# Patient Record
Sex: Male | Born: 1940 | Race: Black or African American | Hispanic: No | Marital: Married | Smoking: Never smoker
Health system: Southern US, Community
[De-identification: ages and names within clinical notes are randomized; demographics above are authoritative.]

## PROBLEM LIST (undated history)

## (undated) DIAGNOSIS — I10 Essential (primary) hypertension: Secondary | ICD-10-CM

## (undated) DIAGNOSIS — J302 Other seasonal allergic rhinitis: Secondary | ICD-10-CM

## (undated) DIAGNOSIS — C801 Malignant (primary) neoplasm, unspecified: Secondary | ICD-10-CM

## (undated) DIAGNOSIS — R29898 Other symptoms and signs involving the musculoskeletal system: Secondary | ICD-10-CM

## (undated) HISTORY — PX: PROSTATE SURGERY: SHX751

## (undated) HISTORY — DX: Other symptoms and signs involving the musculoskeletal system: R29.898

## (undated) HISTORY — DX: Other seasonal allergic rhinitis: J30.2

## (undated) HISTORY — PX: KNEE SURGERY: SHX244

---

## 2009-08-30 DIAGNOSIS — M1990 Unspecified osteoarthritis, unspecified site: Secondary | ICD-10-CM | POA: Insufficient documentation

## 2012-03-13 ENCOUNTER — Emergency Department (HOSPITAL_BASED_OUTPATIENT_CLINIC_OR_DEPARTMENT_OTHER)
Admission: EM | Admit: 2012-03-13 | Discharge: 2012-03-13 | Disposition: A | Discharge status: Home / Self Care | Payer: Medicare Other | Attending: Emergency Medicine | Admitting: Emergency Medicine

## 2012-03-13 ENCOUNTER — Encounter (HOSPITAL_BASED_OUTPATIENT_CLINIC_OR_DEPARTMENT_OTHER): Payer: Self-pay | Admitting: *Deleted

## 2012-03-13 ENCOUNTER — Emergency Department (HOSPITAL_BASED_OUTPATIENT_CLINIC_OR_DEPARTMENT_OTHER): Payer: Medicare Other

## 2012-03-13 DIAGNOSIS — I10 Essential (primary) hypertension: Secondary | ICD-10-CM | POA: Insufficient documentation

## 2012-03-13 DIAGNOSIS — Z8679 Personal history of other diseases of the circulatory system: Secondary | ICD-10-CM | POA: Insufficient documentation

## 2012-03-13 DIAGNOSIS — Z859 Personal history of malignant neoplasm, unspecified: Secondary | ICD-10-CM | POA: Insufficient documentation

## 2012-03-13 DIAGNOSIS — R55 Syncope and collapse: Secondary | ICD-10-CM | POA: Insufficient documentation

## 2012-03-13 DIAGNOSIS — Z79899 Other long term (current) drug therapy: Secondary | ICD-10-CM | POA: Insufficient documentation

## 2012-03-13 HISTORY — DX: Essential (primary) hypertension: I10

## 2012-03-13 HISTORY — DX: Malignant (primary) neoplasm, unspecified: C80.1

## 2012-03-13 LAB — CBC WITH DIFFERENTIAL/PLATELET
Basophils Absolute: 0 10*3/uL (ref 0.0–0.1)
Basophils Relative: 0 % (ref 0–1)
Eosinophils Absolute: 0.3 10*3/uL (ref 0.0–0.7)
Eosinophils Relative: 4 % (ref 0–5)
HCT: 36.3 % — ABNORMAL LOW (ref 39.0–52.0)
MCH: 28.3 pg (ref 26.0–34.0)
MCHC: 33.3 g/dL (ref 30.0–36.0)
MCV: 85 fL (ref 78.0–100.0)
Monocytes Absolute: 0.9 10*3/uL (ref 0.1–1.0)
RDW: 13.8 % (ref 11.5–15.5)

## 2012-03-13 LAB — URINALYSIS, ROUTINE W REFLEX MICROSCOPIC
Glucose, UA: NEGATIVE mg/dL
Hgb urine dipstick: NEGATIVE
Leukocytes, UA: NEGATIVE
Specific Gravity, Urine: 1.022 (ref 1.005–1.030)
Urobilinogen, UA: 1 mg/dL (ref 0.0–1.0)

## 2012-03-13 LAB — TROPONIN I: Troponin I: <0.3 ng/mL (ref <0.30)

## 2012-03-13 LAB — RAPID URINE DRUG SCREEN, HOSP PERFORMED
Amphetamines: NOT DETECTED
Barbiturates: NOT DETECTED
Benzodiazepines: NOT DETECTED
Cocaine: NOT DETECTED
Opiates: NOT DETECTED
Tetrahydrocannabinol: NOT DETECTED

## 2012-03-13 LAB — COMPREHENSIVE METABOLIC PANEL
AST: 18 U/L (ref 0–37)
Albumin: 3.6 g/dL (ref 3.5–5.2)
BUN: 19 mg/dL (ref 6–23)
Calcium: 9.3 mg/dL (ref 8.4–10.5)
Creatinine, Ser: 1.4 mg/dL — ABNORMAL HIGH (ref 0.50–1.35)

## 2012-03-13 LAB — ETHANOL: Alcohol, Ethyl (B): 151 mg/dL — ABNORMAL HIGH (ref 0–11)

## 2012-03-13 NOTE — ED Notes (Addendum)
Passed out at a social gathering after having 2 alcoholic drinks. Incontinent of urine and had jerking of his left arm. Pt states he has had this same thing happen 3 other times without a reason being found. He is alert oriented and ambulatory on arrival. Wife is with him.

## 2012-03-13 NOTE — ED Provider Notes (Addendum)
History     CSN: [id number]  Arrival date & time 03/13/12  1754   First MD Initiated Contact with Patient 03/13/12 1806      Chief Complaint  Patient presents with  . Loss of Consciousness    (Consider location/radiation/quality/duration/timing/severity/associated sxs/prior treatment) Patient is a 72 y.o. male presenting with syncope. The history is provided by the patient.  Loss of Consciousness  This is a recurrent problem. The current episode started less than 1 hour ago. The problem occurs constantly. The problem has been resolved. He lost consciousness for a period of less than one minute. Associated with: Patient was sitting at the kitchen table drinking an alcoholic beverage when his wife looked over in his left hand was quivering and his head was slumped down. She asked him if he was okay and he answered quickly after and ask her what was going on. Pertinent negatives include chest pain, diaphoresis, dizziness, focal sensory loss, focal weakness, headaches, light-headedness, nausea, palpitations, seizures, visual change, vomiting and weakness. He has tried nothing for the symptoms. The treatment provided significant relief. His past medical history is significant for HTN. Past medical history comments: Prior history of A. fib with cardioversion into sinus rhythm. Also 3-4 similar episodes as today with negative workups in the past.    Past Medical History  Diagnosis Date  . Hypertension   . Cancer     Past Surgical History  Procedure Laterality Date  . Knee surgery    . Prostate surgery      No family history on file.  History  Substance Use Topics  . Smoking status: Never Smoker   . Smokeless tobacco: Not on file  . Alcohol Use: Yes      Review of Systems  Constitutional: Negative for diaphoresis.  Cardiovascular: Positive for syncope. Negative for chest pain and palpitations.  Gastrointestinal: Negative for nausea and vomiting.  Neurological: Negative for  dizziness, focal weakness, seizures, weakness, light-headedness and headaches.  All other systems reviewed and are negative.    Allergies  Iodine  Home Medications   Current Outpatient Rx  Name  Route  Sig  Dispense  Refill  . LOSARTAN POTASSIUM PO   Oral   Take by mouth.           BP 116/65  Pulse 79  Temp(Src) 98.2 F (36.8 C) (Oral)  Resp 18  Wt 235 lb (106.595 kg)  SpO2 100%  Physical Exam  Nursing note and vitals reviewed. Constitutional: He is oriented to person, place, and time. He appears well-developed and well-nourished. No distress.  HENT:  Head: Normocephalic and atraumatic.  Mouth/Throat: Oropharynx is clear and moist.  Eyes: Conjunctivae and EOM are normal. Pupils are equal, round, and reactive to light.  Neck: Normal range of motion. Neck supple.  Cardiovascular: Normal rate, regular rhythm and intact distal pulses.   No murmur heard. Pulmonary/Chest: Effort normal and breath sounds normal. No respiratory distress. He has no wheezes. He has no rales.  Abdominal: Soft. He exhibits no distension. There is no tenderness. There is no rebound and no guarding.  Musculoskeletal: Normal range of motion. He exhibits no edema and no tenderness.  Neurological: He is alert and oriented to person, place, and time.  Skin: Skin is warm and dry. No rash noted. No erythema.  Psychiatric: He has a normal mood and affect. His behavior is normal.    ED Course  Procedures (including critical care time)  Labs Reviewed  CBC WITH DIFFERENTIAL - Abnormal; Notable for  the following:    Hemoglobin 12.1 (*)    HCT 36.3 (*)    All other components within normal limits  COMPREHENSIVE METABOLIC PANEL - Abnormal; Notable for the following:    Glucose, Bld 110 (*)    Creatinine, Ser 1.40 (*)    Total Bilirubin 0.2 (*)    GFR calc non Af Amer 49 (*)    GFR calc Af Amer 57 (*)    All other components within normal limits  URINALYSIS, ROUTINE W REFLEX MICROSCOPIC - Abnormal;  Notable for the following:    Color, Urine AMBER (*)    Bilirubin Urine SMALL (*)    Ketones, ur 15 (*)    All other components within normal limits  ETHANOL - Abnormal; Notable for the following:    Alcohol, Ethyl (B) 151 (*)    All other components within normal limits  URINE RAPID DRUG SCREEN (HOSP PERFORMED)  TROPONIN I   Dg Chest 2 View  03/13/2012  *RADIOLOGY REPORT*  Clinical Data: Syncope.  Seizure.  CHEST - 2 VIEW  Comparison: None.  Findings: Lungs are clear.  Heart size is normal.  No pneumothorax or pleural effusion.  IMPRESSION: No acute disease.   Original Report Authenticated By: Holley Dexter, M.D.      Date: 03/13/2012  Rate: 71  Rhythm: normal sinus rhythm  QRS Axis: normal  Intervals: normal  ST/T Wave abnormalities: nonspecific T wave changes t wave inversion in inferior leads  Conduction Disutrbances:none  Narrative Interpretation:   Old EKG Reviewed: none available    1. Syncope       MDM   Patient presenting with a possible syncopal episode today. Family states that for less than 1 minute they noticed his head slumped and mild trembling of his left hand.  When wife went over to arouse him and ask him what was wrong he did answer her. He had no chest pain, vomiting, diaphoresis, palpitations or prodromal symptoms. He states that he been drinking alcohol today but otherwise feels fine. He was able to ambulate in without any difficulty. Patient does have a history of A. fib which was cardioverted 2 years ago and no further episodes.  He states he's had 3 episodes of syncope in the past where he became diaphoretic prior to the episode but that did not happen today. He's been checked out multiple times in all evaluations have been negative. Today patient's alcohol level is 151 but a negative drug screen and otherwise normal labs. EKG shows nonspecific T-wave inversion inferiorly but otherwise normal there is no old to compare. Low suspicion for this being a  cardiac event such as MI or ACS. Possibility of dysrhythmia especially given patient's history of A. fib. However today there is no evidence of dysrhythmias on EKG or monitor.  Feel this could be related to the alcohol most likely. Patient is from [place] and plans on traveling back to her tomorrow. He will then follow up with his regular doctor and they will discuss whether he needs to wear a heart monitor.        Gwyneth Sprout, MD 03/13/12 1931  Gwyneth Sprout, MD 03/13/12 Barry Brunner  Gwyneth Sprout, MD 03/13/12 [phone]

## 2014-03-08 IMAGING — CR DG CHEST 2V
2 series · 2 of 2 positions shown · non-contrast
Comparison: None.

CLINICAL DATA: Syncope.  Seizure.

CHEST - 2 VIEW

[w chest pa]
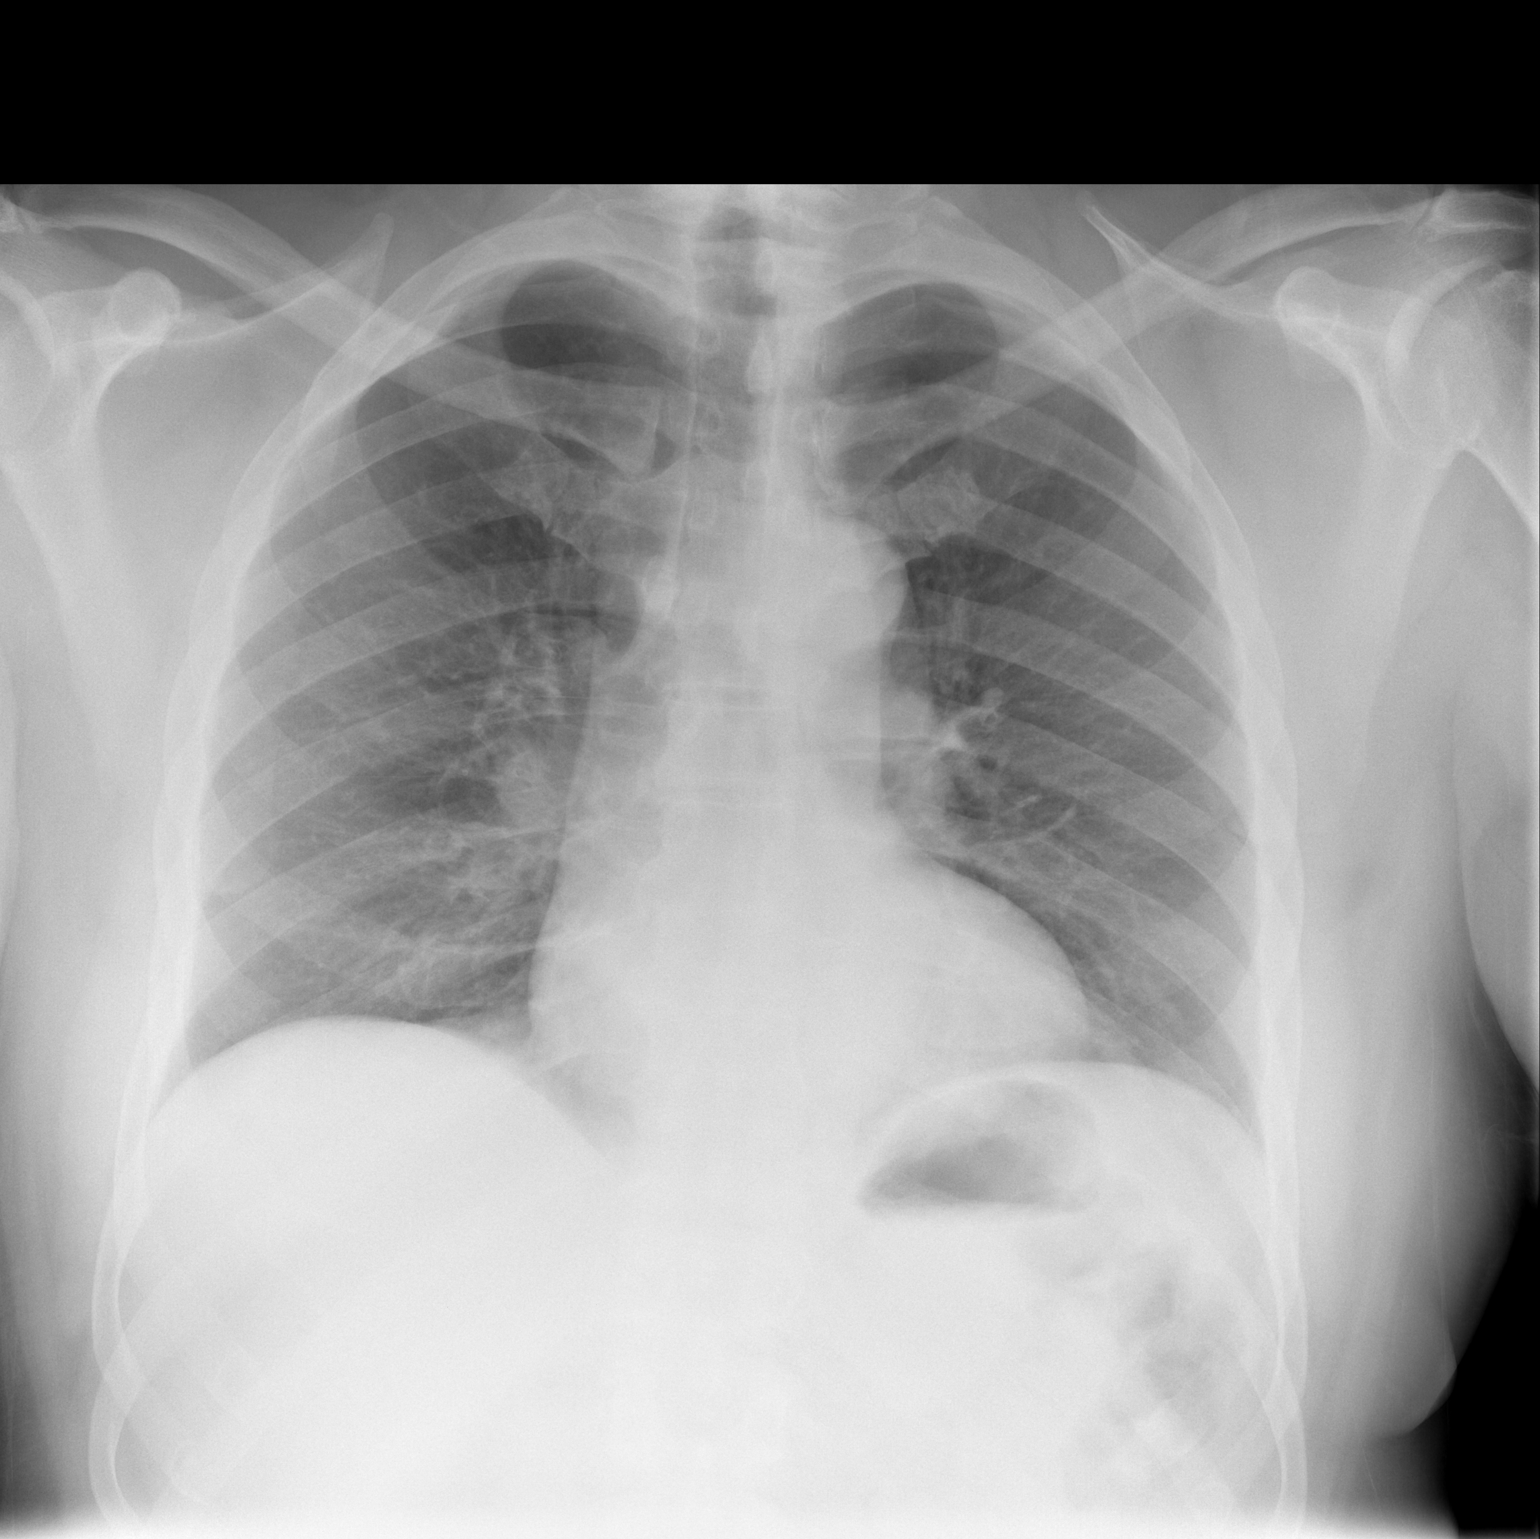

[w chest lat]
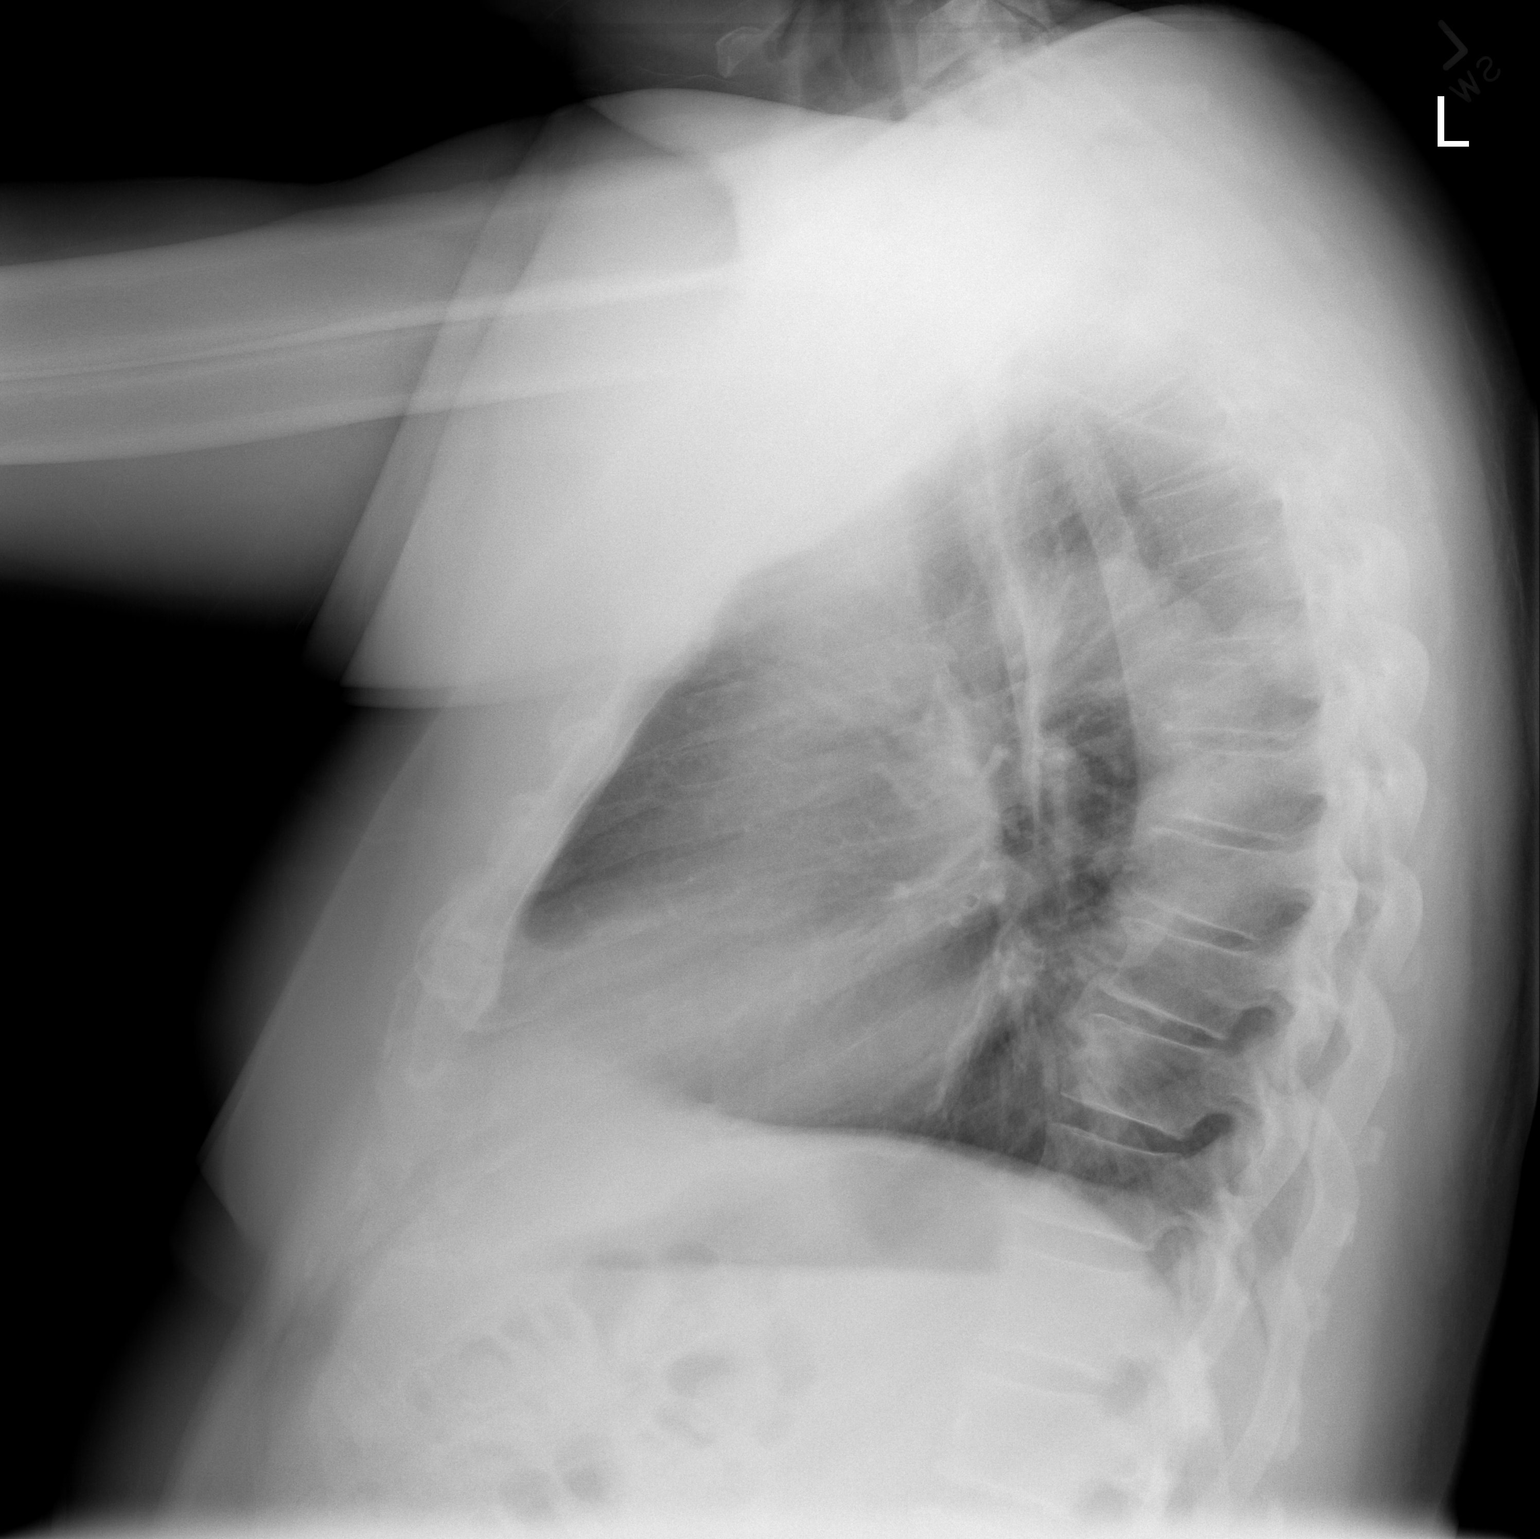

[2 of 2 positions shown; findings below may reference images not displayed]

FINDINGS: Lungs are clear.  Heart size is normal.  No pneumothorax
or pleural effusion.
IMPRESSION: No acute disease.

## 2014-07-28 DIAGNOSIS — R269 Unspecified abnormalities of gait and mobility: Secondary | ICD-10-CM | POA: Insufficient documentation

## 2014-08-14 DIAGNOSIS — E871 Hypo-osmolality and hyponatremia: Secondary | ICD-10-CM | POA: Insufficient documentation

## 2017-09-05 DIAGNOSIS — N1831 Chronic kidney disease, stage 3a: Secondary | ICD-10-CM | POA: Insufficient documentation

## 2020-01-21 DIAGNOSIS — Z8679 Personal history of other diseases of the circulatory system: Secondary | ICD-10-CM | POA: Insufficient documentation

## 2020-07-29 DIAGNOSIS — N189 Chronic kidney disease, unspecified: Secondary | ICD-10-CM | POA: Insufficient documentation

## 2020-07-29 DIAGNOSIS — I4891 Unspecified atrial fibrillation: Secondary | ICD-10-CM | POA: Insufficient documentation

## 2020-07-29 DIAGNOSIS — E669 Obesity, unspecified: Secondary | ICD-10-CM | POA: Insufficient documentation

## 2021-01-25 DIAGNOSIS — Z8546 Personal history of malignant neoplasm of prostate: Secondary | ICD-10-CM | POA: Insufficient documentation

## 2022-02-21 ENCOUNTER — Encounter: Payer: Self-pay | Admitting: Family Medicine

## 2022-02-21 ENCOUNTER — Ambulatory Visit (INDEPENDENT_AMBULATORY_CARE_PROVIDER_SITE_OTHER): Payer: Medicare Other | Admitting: Family Medicine

## 2022-02-21 VITALS — BP 122/70 | HR 64 | Temp 98.0°F | Ht 71.0 in | Wt 249.0 lb

## 2022-02-21 DIAGNOSIS — F411 Generalized anxiety disorder: Secondary | ICD-10-CM

## 2022-02-21 DIAGNOSIS — I10 Essential (primary) hypertension: Secondary | ICD-10-CM

## 2022-02-21 DIAGNOSIS — J4540 Moderate persistent asthma, uncomplicated: Secondary | ICD-10-CM

## 2022-02-21 DIAGNOSIS — R29898 Other symptoms and signs involving the musculoskeletal system: Secondary | ICD-10-CM

## 2022-02-21 DIAGNOSIS — J45909 Unspecified asthma, uncomplicated: Secondary | ICD-10-CM

## 2022-02-21 DIAGNOSIS — K5900 Constipation, unspecified: Secondary | ICD-10-CM | POA: Diagnosis not present

## 2022-02-21 DIAGNOSIS — N3281 Overactive bladder: Secondary | ICD-10-CM

## 2022-02-21 MED ORDER — TRELEGY ELLIPTA 200-62.5-25 MCG/ACT IN AEPB: INHALATION_SPRAY | RESPIRATORY_TRACT | 11 refills | Status: DC

## 2022-02-21 NOTE — Patient Instructions (Addendum)
Stay hydrated.   If you do not hear anything about your referral in the next 1-2 weeks, call our office and ask for an update.  Go back on Flonase.   Sleep Hygiene Tips: Do not watch TV or look at screens within 1 hour of going to bed. If you do, make sure there is a blue light filter (nighttime mode) involved. Try to go to bed around the same time every night. Wake up at the same time within 1 hour of regular time. Ex: If you wake up at 7 AM for work, do not sleep past 8 AM on days that you don't work. Do not drink alcohol before bedtime. Do not consume caffeine-containing beverages after noon or within 9 hours of intended bedtime. Get regular exercise/physical activity in your life, but not within 2 hours of planned bedtime. Do not take naps.  Do not eat within 2 hours of planned bedtime. Melatonin, 3 mg 30-60 minutes before planned bedtime may be helpful.  The bed should be for sleep or sex only. If after 20-30 minutes you are unable to fall asleep, get up and do something relaxing. Do this until you feel ready to go to sleep again.   Please consider counseling. Contact [phone] to schedule an appointment or inquire about cost/insurance coverage.  [facility] located at [place], [place], [place].  Phone number = [phone].  Dr. Lennice Sites - Adult Psychiatry.    [facility] located at [place], [place], [place]. Phone number = [phone].   [facility] located at [address], [place], [place].  Phone number = [phone].   [facility] located at [place], [place], [place].  Phone number = [phone].  Let us know if you need anything.

## 2022-02-21 NOTE — Progress Notes (Signed)
Chief Complaint  Patient presents with   New Patient (Initial Visit)    Constipation Insomnia        New Patient Visit SUBJECTIVE: HPI: Calvin Bautista is an 82 y.o.male who is being seen for establishing care.  He is here with his wife.  The patient was previously seen in [place].  Constipation Goes 3 times weekly on average. Has tried various OTC meds like milk of mag, Benefiber, MiraLAX, Senna. He tries to stay hydrated. Bitter melon helps.   Insomnia Around 1 yr has been having trouble staying asleep. Falling asleep is not an issue. Sometimes has racing thoughts. He tried melatonin with helped but he felt drowsy the next day.   Overactive bladder The patient has a history of overactive bladder currently taking Detrol 4 mg daily.  He reports compliance and no adverse effects that he is aware of.  It is helping with the symptoms.  He has no pain, bleeding, or discharge.  Patient has a history of moderate persistent asthma.  He is currently taking Trelegy 200-62.5-25 mcg, 1 puff daily.  He reports compliance and no adverse effects.  He has a history of allergies which he is compliant with Astelin, Allegra, and Singulair 10 mg daily.  His breathing is sound on this regimen.  He will sometimes have allergy symptoms.  He saw an allergist in [place] and was told he is allergic to pollen, dust, and grass.  Patient has a history of bilateral knee replacements.  After having his right knee replaced, he had a successful rehab.  After his left knee was replaced, he was less diligent with his home exercise program.  He still has residual weakness and balance issues because of it.  Sometimes his left lower extremity will drag.  He never saw physical therapy after the initial surgical rehab.  He likes to golf and this issue is preventing him from safely participating.  Past Medical History:  Diagnosis Date   Cancer ([place])    Hypertension    Leg weakness, bilateral    left is worse; started around 2014    Seasonal allergies    Past Surgical History:  Procedure Laterality Date   KNEE SURGERY     PROSTATE SURGERY     History reviewed. No pertinent family history. Allergies  Allergen Reactions   Iodine Anaphylaxis    Current Outpatient Medications:    albuterol (VENTOLIN HFA) 108 (90 Base) MCG/ACT inhaler, Inhale 2 puffs into the lungs every 6 (six) hours as needed., Disp: , Rfl:    azelastine (ASTELIN) 0.1 % nasal spray, Place 1 spray into both nostrils 2 (two) times daily., Disp: , Rfl:    diltiazem (TIAZAC) 240 MG 24 hr capsule, Take 240 mg by mouth daily., Disp: , Rfl:    fexofenadine (ALLEGRA) 180 MG tablet, Take 180 mg by mouth daily., Disp: , Rfl:    losartan (COZAAR) 50 MG tablet, Take 50 mg by mouth daily., Disp: , Rfl:    montelukast (SINGULAIR) 10 MG tablet, Take 10 mg by mouth at bedtime., Disp: , Rfl:    tolterodine (DETROL LA) 4 MG 24 hr capsule, Take 4 mg by mouth daily., Disp: , Rfl:    Fluticasone-Umeclidin-Vilant (TRELEGY ELLIPTA) 200-62.5-25 MCG/ACT AEPB, Inhale 1 puff daily., Disp: 28 each, Rfl: 11  OBJECTIVE: BP 122/70 (BP Location: Left Arm, Patient Position: Sitting, Cuff Size: Large)   Pulse 64   Temp 98 F (36.7 C) (Oral)   Ht 5' 11"$  (1.803 m)   Wt 249  lb (112.9 kg)   SpO2 98%   BMI 34.73 kg/m  General:  well developed, well nourished, in no apparent distress Skin:  no significant moles, warts, or growths Nose:  nares patent, septum midline, mucosa normal Throat/Pharynx:  lips and gingiva without lesion; tongue and uvula midline; non-inflamed pharynx; no exudates or postnasal drainage Lungs:  clear to auscultation, breath sounds equal bilaterally, no respiratory distress Cardio:  regular rate and rhythm, no LE edema or bruits Musculoskeletal:  symmetrical muscle groups noted without atrophy or deformity Neuro:  gait normal Psych: well oriented with normal range of affect and appropriate judgment/insight  ASSESSMENT/PLAN: Moderate persistent asthma  without complication - Plan: albuterol (VENTOLIN HFA) 108 (90 Base) MCG/ACT inhaler, Fluticasone-Umeclidin-Vilant (TRELEGY ELLIPTA) 200-62.5-25 MCG/ACT AEPB  GAD (generalized anxiety disorder)  Constipation, unspecified constipation type  Essential hypertension - Plan: diltiazem (TIAZAC) 240 MG 24 hr capsule, losartan (COZAAR) 50 MG tablet  OAB (overactive bladder) - Plan: tolterodine (DETROL LA) 4 MG 24 hr capsule  Weakness of left lower extremity - Plan: Ambulatory referral to Physical Therapy  Asthma due to environmental allergies - Plan: azelastine (ASTELIN) 0.1 % nasal spray, fexofenadine (ALLEGRA) 180 MG tablet, montelukast (SINGULAIR) 10 MG tablet  Chronic, stable.  Continue Trelegy 200-62.5-25 mcg daily.  If there are cost issues, would refer him to allergy team. He has sleep issues, and this is likely due to underlying anxiety.  Counseling information provided in paperwork.  He politely declined any medication at this time.  He will take 3 mg of melatonin to see if this can help with sleep without affecting him the next day. Continue bitter melon.  Stay hydrated. Chronic, stable.  Continue diltiazem 240 mg daily, losartan 50 mg daily.  Counseled on diet and exercise. Chronic, stable.  Continue Detrol 4 mg daily.  This medicine could be contributing to his constipation but is better now and is helpful, we will avoid making any changes. Refer to physical therapy. Chronic, stable.  Go back on Flonase, continue Astelin, Allegra, and Singulair 10 mg daily. Patient should return in 6 months or as needed. The patient and his spouse voiced understanding and agreement to the plan.   [place], DO 02/21/22  1:39 PM

## 2022-04-04 ENCOUNTER — Telehealth: Payer: Self-pay | Admitting: Family Medicine

## 2022-04-04 NOTE — Telephone Encounter (Signed)
Contacted Calvin Bautista to schedule their annual wellness visit. Appointment made for 04/13/2022.  Sherol Dade; Care Guide Ambulatory Clinical [facility] Direct Dial: [phone]

## 2022-04-13 ENCOUNTER — Telehealth: Payer: Self-pay | Admitting: Family Medicine

## 2022-04-13 ENCOUNTER — Ambulatory Visit: Payer: Medicare Other

## 2022-04-13 NOTE — Telephone Encounter (Signed)
Copied from [place] [id number]. Topic: Medicare AWV >> Apr 13, 2022  8:56 AM Devoria Glassing wrote: Reason for CRM: Called patient to reschedule Medicare Annual Wellness Visit (AWV). LVM 04/13/2022 to r/s awv scheduled AWV too soon due 11/2022   Last date of AWV: 11/29/21  Please schedule an appointment at any time with Beatris Ship, [place]  .  If any questions, please contact me.  Thank you ,  Sherol Dade; [facility] Direct Dial: [phone]

## 2022-05-09 ENCOUNTER — Telehealth: Payer: Self-pay | Admitting: Family Medicine

## 2022-05-09 DIAGNOSIS — R29898 Other symptoms and signs involving the musculoskeletal system: Secondary | ICD-10-CM

## 2022-05-09 NOTE — Telephone Encounter (Signed)
Pt stated he went over the the physical therapy office he was referred too, and they stated it needed to be renewed.

## 2022-05-09 NOTE — Telephone Encounter (Signed)
Referral done and patient aware

## 2022-06-01 NOTE — Therapy (Signed)
OUTPATIENT PHYSICAL THERAPY LOWER EXTREMITY EVALUATION   Patient Name: Calvin Bautista MRN: [id number] DOB:December 29, 1940, 82 y.o., male Today's Date: 06/02/2022   END OF SESSION:  PT End of Session - 06/02/22 0932     Visit Number 1    Date for PT Re-Evaluation 07/28/22    Authorization Type Medicare & [employer]    Progress Note Due on Visit 10    PT Start Time 0932    PT Stop Time 1023    PT Time Calculation (min) 51 min    Activity Tolerance Patient tolerated treatment well    Behavior During Therapy WFL for tasks assessed/performed             Past Medical History:  Diagnosis Date   Cancer (HCC)    Hypertension    Leg weakness, bilateral    left is worse; started around 2014   Seasonal allergies    Past Surgical History:  Procedure Laterality Date   KNEE SURGERY     PROSTATE SURGERY     Patient Active Problem List   Diagnosis Date Noted   Moderate persistent asthma without complication 02/21/2022   Essential hypertension 02/21/2022   GAD (generalized anxiety disorder) 02/21/2022   Weakness of left lower extremity 02/21/2022   OAB (overactive bladder) 02/21/2022    PCP: Sharlene Dory, DO   REFERRING PROVIDER: Sharlene Dory, DO  REFERRING DIAG: [phone] (ICD-10-CM) - Weakness of left lower extremity   THERAPY DIAG:  Unsteadiness on feet  Muscle weakness (generalized)  Repeated falls  RATIONALE FOR EVALUATION AND TREATMENT: Rehabilitation  ONSET DATE: Chronic x 10+ yrs, worsening since fall a few years ago  NEXT MD VISIT: 08/22/22   SUBJECTIVE:                                                                                                                                                                                                         SUBJECTIVE STATEMENT: Pt reports he has had both knees replaced over a decade ago. A few years ago he fell off a ladder in his home. Since then, he has noted more weakness in his legs as well as  decreased balance which affects his ability to play golf.  Issues occur with LOB during golf swing which has caused him to fall. Once he falls, he has difficulty getting up.  Otherwise when he falls it usually occurs while pivoting in standing.  Uses a walker in the shower to hold on and intermittently uses as cane in the house or when going out.  PAIN: Are you  having pain? 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 Yes. Number of falls 3  LIVING ENVIRONMENT: Lives with: lives with their spouse Lives in: House/apartment Stairs: No Has following equipment at home: Single point cane, Environmental consultant - 4 wheeled, and shower chair - walk-in shower  OCCUPATION: Retired  PLOF: Independent and Leisure: golf, gym up to 3x/wk  PATIENT GOALS: "To improve my balance so I can play golf again."   OBJECTIVE: (objective measures completed at initial evaluation unless otherwise dated)  DIAGNOSTIC FINDINGS:  11/06/20 - Lumbar MRI: CONCLUSION:  1. There is severe right L5-S1 neural foraminal stenosis, a potential etiology for right L5 nerve root impingement.  There is moderate to severe left L4-L5 neural foraminal stenosis.  There is moderate right L4-L5 neural foraminal stenosis.  There is moderate left L5-S1 neural foraminal stenosis.  2. There is moderate L4-L5 central canal stenosis.  There is mild L3-L4 central canal stenosis.  3. There is multilevel facet arthropathy, most severe at the L4-L5 level, where there is 4 mm anterolisthesis of L4 over L5   PATIENT SURVEYS:  ABC scale 690 / 1600 = 43.1 % LEFS 38 / 80 = 47.5 %  COGNITION: Overall cognitive status: Within functional limits for tasks assessed    SENSATION: WFL  EDEMA:  Mild intermittent edema  MUSCLE LENGTH: Hamstrings: mod  tight L, mild tight R ITB: mod tight B Piriformis: mod tight L>R Hip IR: mod tight B Hip flexors: mod/severe tight B Quads: mod tight B Heelcord: NT  POSTURE:  rounded shoulders, forward head, flexed trunk , and weight shift right  LOWER EXTREMITY ROM:  Active ROM Right eval Left eval  Knee flexion 6 2  Knee extension 115 104   Passive ROM Right eval Left eval  Knee flexion    Knee extension 0 0  (Blank rows = not tested)  LOWER EXTREMITY MMT:  MMT Right eval Left eval  Hip flexion 4+ 4  Hip extension 2+ 3-  Hip abduction 3- 3-  Hip adduction 3- 3-  Hip internal rotation 3+ 4-  Hip external rotation 4- 4-  Knee flexion 4+ 4  Knee extension 4 4  Ankle dorsiflexion 4+ 3+  Ankle plantarflexion 4 (16 SLS HR - limited lift) 4 (13 SLS HR - limited lift)  Ankle inversion    Ankle eversion     (Blank rows = not tested)  LOWER EXTREMITY SPECIAL TESTS:  Hip special tests: Ober's test: positive   FUNCTIONAL TESTS:  5 times sit to stand: TBA Timed up and go (TUG): TBA 10 meter walk test: TBA Berg Balance Scale: 28/56; < 36 high risk for falls (close to 100%) Dynamic Gait Index: TBA Functional gait assessment: TBA  TRANSFERS: Assistive device utilized: None  Sit to stand: Modified independence - increased weight shift to R LE w/o UE assist Stand to sit: Modified independence - increased weight shift to R LE  Chair to chair: Modified independence Floor:  NT  GAIT: Distance walked: 60 ft Assistive device utilized: None Level of assistance: SBA Gait pattern: step through pattern, decreased stride length, decreased hip/knee flexion- Right, decreased hip/knee flexion- Left, decreased trunk rotation, trunk flexed, wide BOS, poor foot clearance- Right, and poor foot clearance- Left Comments: Slow cadence with increased lateral sway   TODAY'S TREATMENT:  06/02/22 Eval only   PATIENT EDUCATION:  Education details: PT eval findings and anticipated POC  Person  educated: Patient Education method: Explanation Education comprehension: verbalized understanding  HOME EXERCISE PROGRAM: TBD   ASSESSMENT:  CLINICAL IMPRESSION: [name] is a 82 y.o. male who was seen today for physical therapy evaluation and treatment for L LE weakness.  He reports weakness originated following bilateral TKA 10+ years ago, exacerbated by a fall off of a ladder a few years ago.  Since this time he reports increased incidences of falls, most often associated with turning motions including attempting to swing a golf club.  He reports at least 3 falls in the past 6 months.  Current deficits include decreased LE flexibility, mildly limited knee ROM L>R, bilateral LE weakness, balance deficits and recurrent falls.  Initial balance testing with Sharlene Motts indicates a high risk for fall per score of 28/56.  Further balance testing indicated to identify risk for falls related to ambulation and transitional motions.  Khylen will benefit from skilled PT to address above deficits to improve mobility and activity tolerance with decreased risk for falls.   OBJECTIVE IMPAIRMENTS: Abnormal gait, decreased activity tolerance, decreased balance, decreased coordination, decreased knowledge of condition, decreased knowledge of use of DME, decreased mobility, difficulty walking, decreased ROM, decreased strength, decreased safety awareness, increased fascial restrictions, impaired perceived functional ability, increased muscle spasms, impaired flexibility, improper body mechanics, and postural dysfunction.   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   REHAB POTENTIAL: Good  CLINICAL DECISION MAKING: Evolving/moderate complexity  EVALUATION COMPLEXITY: Moderate   GOALS: Goals reviewed with patient? Yes  SHORT TERM GOALS: Target date: 06/30/2022  Patient will be independent with initial HEP. Baseline: TBD Goal status: INITIAL  2.  Complete balance assessment and update LTG's as indicated. Baseline: 5xSTS, , TUG, DGI vs FGA pending Goal status: INITIAL  3.  Patient will verbalize understanding of fall prevention measures in home to reduce risk for falls. Baseline: Patient experiencing recurrent falls, with at least 3 falls in the past 6 months. Goal status: INITIAL  LONG TERM GOALS: Target date: 07/28/2022  Patient will be independent with advanced/ongoing HEP to improve outcomes and carryover.  Baseline:  Goal status: INITIAL  2.  Patient will demonstrate improved L knee AROM to [facility] to allow for normal gait and stair mechanics. Baseline: L knee flexion limited to 104 Goal status: INITIAL  3.  Patient will demonstrate improved B LE strength to >/= 4 to 4+/5 for improved stability and ease of mobility. Baseline: Refer to above MMT table Goal status: INITIAL  4.  Patient will be able to ambulate 600' with or w/o LRAD and normal gait pattern without evidence of instability to safely access community.  Baseline:  Goal status: INITIAL  5. Patient will be able to ascend/descend stairs with 1 HR and reciprocal step pattern safely to access home and community.  Baseline: TBA Goal status: INITIAL  6.  Patient will report >/= 47/80 on LEFS to demonstrate improved functional ability. Baseline: 38 / 80 = 47.5 % Goal status: INITIAL  7.   Patient  will report >/= 62% on ABC scale to demonstrate improved balance confidence. Baseline:  690 / 1600 = 43.1 % Goal status: INITIAL   8.  Patient will demonstrate decreased TUG time to </= 13.5 sec to decrease risk for falls with transitional mobility. Baseline: TBA Goal status: INITIAL   9.  Patient will improve Berg score to >/= 36/56 to improve safety and stability with ADLs in standing and reduce risk for falls. Baseline: 28/56 Goal status: INITIAL   10.  Patient will demonstrate at least 19/24 on DGI to decrease risk of falls. Baseline: TBA Goal status: INITIAL   11.  Patient will be able to swing a golf club without loss of balance to allow him to resume playing golf. Baseline: Patient reports LOB leading to falls when swinging golf club. Goal status: INITIAL   PLAN:  PT FREQUENCY: 2x/week  PT DURATION: 8 weeks  PLANNED INTERVENTIONS: Therapeutic exercises, Therapeutic activity, Neuromuscular re-education, Balance training, Gait training, Patient/Family education, Self Care, Joint mobilization, Stair training, DME instructions, Dry Needling, Electrical stimulation, Spinal mobilization, Taping, Manual therapy, and Re-evaluation  PLAN FOR NEXT SESSION: Complete balance assessment - 5xSTS, , TUG, DGI vs FGA; create initial HEP   Marry Guan, PT 06/02/2022, 12:27 PM

## 2022-06-02 ENCOUNTER — Other Ambulatory Visit: Payer: Self-pay

## 2022-06-02 ENCOUNTER — Encounter: Payer: Self-pay | Admitting: Physical Therapy

## 2022-06-02 ENCOUNTER — Ambulatory Visit: Payer: Medicare HMO | Attending: Family Medicine | Admitting: Physical Therapy

## 2022-06-02 DIAGNOSIS — M6281 Muscle weakness (generalized): Secondary | ICD-10-CM | POA: Diagnosis present

## 2022-06-02 DIAGNOSIS — R29898 Other symptoms and signs involving the musculoskeletal system: Secondary | ICD-10-CM | POA: Insufficient documentation

## 2022-06-02 DIAGNOSIS — R296 Repeated falls: Secondary | ICD-10-CM | POA: Diagnosis present

## 2022-06-02 DIAGNOSIS — R2681 Unsteadiness on feet: Secondary | ICD-10-CM | POA: Insufficient documentation

## 2022-06-09 ENCOUNTER — Ambulatory Visit: Payer: Medicare HMO | Admitting: Physical Therapy

## 2022-06-09 ENCOUNTER — Encounter: Payer: Self-pay | Admitting: Physical Therapy

## 2022-06-09 DIAGNOSIS — R2681 Unsteadiness on feet: Secondary | ICD-10-CM

## 2022-06-09 DIAGNOSIS — M6281 Muscle weakness (generalized): Secondary | ICD-10-CM

## 2022-06-09 DIAGNOSIS — R296 Repeated falls: Secondary | ICD-10-CM

## 2022-06-09 NOTE — Therapy (Signed)
OUTPATIENT PHYSICAL THERAPY TREATMENT   Patient Name: Calvin Bautista MRN: [id number] DOB:07-Jan-1941, 82 y.o., male Today's Date: 06/09/2022   END OF SESSION:  PT End of Session - 06/09/22 0939     Visit Number 2    Date for PT Re-Evaluation 07/28/22    Authorization Type Medicare & [employer]    Progress Note Due on Visit 10    PT Start Time [phone]   Pt arrived late   PT Stop Time 1020    PT Time Calculation (min) 41 min    Activity Tolerance Patient tolerated treatment well    Behavior During Therapy WFL for tasks assessed/performed              Past Medical History:  Diagnosis Date   Cancer (HCC)    Hypertension    Leg weakness, bilateral    left is worse; started around 2014   Seasonal allergies    Past Surgical History:  Procedure Laterality Date   KNEE SURGERY     PROSTATE SURGERY     Patient Active Problem List   Diagnosis Date Noted   Moderate persistent asthma without complication 02/21/2022   Essential hypertension 02/21/2022   GAD (generalized anxiety disorder) 02/21/2022   Weakness of left lower extremity 02/21/2022   OAB (overactive bladder) 02/21/2022    PCP: Sharlene Dory, DO   REFERRING PROVIDER: Sharlene Dory, DO  REFERRING DIAG: [id number] (ICD-10-CM) - Weakness of left lower extremity   THERAPY DIAG:  Unsteadiness on feet  Muscle weakness (generalized)  Repeated falls  RATIONALE FOR EVALUATION AND TREATMENT: Rehabilitation  ONSET DATE: Chronic x 10+ yrs, worsening since fall a few years ago  NEXT MD VISIT: 08/22/22   SUBJECTIVE:                                                                                                                                                                                                         SUBJECTIVE STATEMENT: Pt reports he has had both knees replaced over a decade ago. A few years ago he fell off a ladder in his home. Since then, he has noted more weakness in his legs as well as  decreased balance which affects his ability to play golf.  Issues occur with LOB during golf swing which has caused him to fall. Once he falls, he has difficulty getting up.  Otherwise when he falls it usually occurs while pivoting in standing.  Uses a walker in the shower to hold on and intermittently uses as cane in the house or when going out.  PAIN: Are you having pain? 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 Yes. Number of falls 3  LIVING ENVIRONMENT: Lives with: lives with their spouse Lives in: House/apartment Stairs: No Has following equipment at home: Single point cane, Environmental consultant - 4 wheeled, and shower chair - walk-in shower  OCCUPATION: Retired  PLOF: Independent and Leisure: golf, gym up to 3x/wk  PATIENT GOALS: "To improve my balance so I can play golf again."   OBJECTIVE: (objective measures completed at initial evaluation unless otherwise dated)  DIAGNOSTIC FINDINGS:  11/06/20 - Lumbar MRI: CONCLUSION:  1. There is severe right L5-S1 neural foraminal stenosis, a potential etiology for right L5 nerve root impingement.  There is moderate to severe left L4-L5 neural foraminal stenosis.  There is moderate right L4-L5 neural foraminal stenosis.  There is moderate left L5-S1 neural foraminal stenosis.  2. There is moderate L4-L5 central canal stenosis.  There is mild L3-L4 central canal stenosis.  3. There is multilevel facet arthropathy, most severe at the L4-L5 level, where there is 4 mm anterolisthesis of L4 over L5   PATIENT SURVEYS:  ABC scale 690 / 1600 = 43.1 % LEFS 38 / 80 = 47.5 %  COGNITION: Overall cognitive status: Within functional limits for tasks assessed    SENSATION: WFL  EDEMA:  Mild intermittent edema  MUSCLE LENGTH: Hamstrings: mod  tight L, mild tight R ITB: mod tight B Piriformis: mod tight L>R Hip IR: mod tight B Hip flexors: mod/severe tight B Quads: mod tight B Heelcord: NT  POSTURE:  rounded shoulders, forward head, flexed trunk , and weight shift right  LOWER EXTREMITY ROM:  Active ROM Right eval Left eval  Knee flexion 6 2  Knee extension 115 104   Passive ROM Right eval Left eval  Knee flexion    Knee extension 0 0  (Blank rows = not tested)  LOWER EXTREMITY MMT:  MMT Right eval Left eval  Hip flexion 4+ 4  Hip extension 2+ 3-  Hip abduction 3- 3-  Hip adduction 3- 3-  Hip internal rotation 3+ 4-  Hip external rotation 4- 4-  Knee flexion 4+ 4  Knee extension 4 4  Ankle dorsiflexion 4+ 3+  Ankle plantarflexion 4 (16 SLS HR - limited lift) 4 (13 SLS HR - limited lift)  Ankle inversion    Ankle eversion     (Blank rows = not tested)  LOWER EXTREMITY SPECIAL TESTS:  Hip special tests: Ober's test: positive   FUNCTIONAL TESTS:  5 times sit to stand: 06/09/22: unable w/o UE assist (falling back into chair), 15.31 sec with single UE assist Timed up and go (TUG): 06/09/22: 14.96 sec, >13.5 sec indicates high fall risk 10 meter walk test: 06/09/22: 12.03 sec; Gait speed = 2.73 ft/sec Berg Balance Scale: 28/56; < 36 high risk for falls (close to 100%) Dynamic Gait Index: 06/09/22: 9/24, < 19 = high risk fall  Functional gait assessment: 06/09/22: 9/30, Scores of 19 or less are predicitve of falls in older community living adults.  TRANSFERS: Assistive device utilized: None  Sit to stand: Modified independence - increased weight shift to R LE w/o UE assist Stand to sit: Modified independence - increased weight shift to R LE  Chair to chair: Modified independence Floor:  NT  GAIT: Distance  walked: 60 ft Assistive device utilized: None Level of assistance: SBA Gait pattern: step through pattern, decreased arm swing- Left, decreased stride length, decreased hip/knee flexion- Right, decreased  hip/knee flexion- Left, decreased trunk rotation, trunk flexed, wide BOS, poor foot clearance- Right, and poor foot clearance- Left Comments: Slow cadence with increased lateral sway   TODAY'S TREATMENT:   06/09/22 THERAPEUTIC ACTIVITIES: 5xSTS: unable w/o UE assist (falling back into chair), 15.31 sec with single UE assist TUG: 14.96 sec, >13.5 sec indicates high fall risk  : 12.03 sec Gait speed = 2.73 ft/sec DGI: 9/24, < 19 = high risk fall  FGA: 9/30,  Scores of 19 or less are predicitve of falls in older community living adults. Stairs: Level of Assistance: SBA and CGA/min A Stair Negotiation Technique: Step to Pattern Alternating Pattern  with Single Rail on Left Number of Stairs: 14  Height of Stairs: 7"  Comments: Alternating pattern on a send however patient only placing ball of foot on stairs causing heel to drop over step edge of step and create posterior LOB, requiring CGA/min assist of PT to prevent loss of balance.  Step-to pattern on descent.  SELF CARE: Discussed results of balance testing and implication related to risk for falls, recommending consistent use of AD for ambulation both in and out of home.  Patient reports his wife frequently fusses at him to use his cane, but he admits to poor compliance with this.  He only uses RW for showering. Reviewed the "Check for Safety - Home Fall Prevention Checklist for Older Adults" to help identify fall risk hazards in the home along with strategies to reduce fall risk at home   06/02/22 Eval only   PATIENT EDUCATION:  Education details: PT eval findings, anticipated POC, and Check for Safety - Home Fall Prevention Checklist for Older Adults   Person educated: Patient Education method: Explanation, Verbal cues, and Handouts Education comprehension: verbalized understanding and needs further education  HOME EXERCISE PROGRAM: Access Code: [email or web address] URL: [email or web address] Date: 06/09/2022 Prepared  by: Glenetta Hew  Patient Education - Check for Safety   ASSESSMENT:  CLINICAL IMPRESSION: Balance testing completed with all standardized test indicating high risk for falls.  When attempting sit to stand without upper extremity assist patient experiences repeated posterior LOB causing him to fall back uncontrolled into chair.  Concern also noted with stair ascent as patient fails to place full foot on step causing him to have posterior weight shift off the edge of the step with associated posterior LOB.  Given identified high risk for fall discussed measures to reduce fall risk in home including consistent use of AD (patient arrived to therapy without any devices today but has SPC and RW at home) and review of "Check for Safety - Home Fall Prevention Checklist for Older Adults" to help identify fall risk hazards in the home along with strategies to reduce fall risk at home.   Calvin Bautista is a 82 y.o. male who was seen today for physical therapy evaluation and treatment for L LE weakness.  He reports weakness originated following bilateral TKA 10+ years ago, exacerbated by a fall off of a ladder a few years ago.  Since this time he reports increased incidences of falls, most often associated with turning motions including attempting to swing a golf club.  He reports at least 3 falls in the past 6 months.  Current deficits include decreased LE flexibility, mildly limited knee ROM L>R, bilateral LE weakness, balance deficits and recurrent falls.  Initial balance testing with Sharlene Motts indicates a high risk for fall per score of 28/56.  Further balance testing indicated to identify risk for falls related to ambulation and transitional motions.  Calvin Bautista will benefit from skilled PT to address above deficits to improve mobility and activity tolerance with decreased risk for falls.   OBJECTIVE IMPAIRMENTS: Abnormal gait, decreased activity tolerance, decreased balance, decreased coordination, decreased  knowledge of condition, decreased knowledge of use of DME, decreased mobility, difficulty walking, decreased ROM, decreased strength, decreased safety awareness, increased fascial restrictions, impaired perceived functional ability, increased muscle spasms, impaired flexibility, improper body mechanics, and postural dysfunction.   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   REHAB POTENTIAL: Good  CLINICAL DECISION MAKING: Evolving/moderate complexity  EVALUATION COMPLEXITY: Moderate   GOALS: Goals reviewed with patient? Yes  SHORT TERM GOALS: Target date: 06/30/2022  Patient will be independent with initial HEP. Baseline: TBD Goal status: IN PROGRESS  2.  Complete balance assessment and update LTG's as indicated. Baseline: 5xSTS, , TUG, DGI vs FGA pending Goal status: MET  3.  Patient will verbalize understanding of fall prevention measures in home to reduce risk for falls. Baseline: Patient experiencing recurrent falls, with at least 3 falls in the past 6 months. Goal status: IN PROGRESS  06/09/22 - discussed need for consistent use of AD for ambulation as well as reviewed the "Check for Safety - Home Fall Prevention Checklist for Older Adults" to help identify fall risk hazards in the home along with strategies to reduce fall risk at home   LONG TERM GOALS: Target date: 07/28/2022  Patient will be independent with advanced/ongoing HEP to improve outcomes  and carryover.  Baseline:  Goal status: IN PROGRESS  2.  Patient will demonstrate improved L knee AROM to [facility] to allow for normal gait and stair mechanics. Baseline: L knee flexion limited to 104 Goal status: IN PROGRESS  3.  Patient will demonstrate improved B LE strength to >/= 4 to 4+/5 for improved stability and ease of mobility. Baseline: Refer to above MMT table Goal status: IN PROGRESS  4.  Patient will be able to ambulate 600' with or w/o LRAD and normal gait pattern without evidence of instability to safely access community.  Baseline:  Goal status: IN PROGRESS  5. Patient will be able to ascend/descend stairs with 1 HR and reciprocal step pattern safely to access home and community.  Baseline: TBA Goal status: IN PROGRESS  6.  Patient will report >/= 47/80 on LEFS to demonstrate improved functional ability. Baseline: 38 / 80 = 47.5 % Goal status: IN PROGRESS  7.   Patient will report >/= 62% on ABC scale to demonstrate improved balance confidence. Baseline: 690 / 1600 = 43.1 % Goal status: IN PROGRESS   8.  Patient will demonstrate decreased TUG time to </= 13.5 sec to decrease risk for falls with transitional mobility. Baseline: 14.96 sec (06/09/22) Goal status: IN PROGRESS   9.  Patient will improve Berg score to >/= 36/56 to improve safety and stability with ADLs in standing and reduce risk for falls. Baseline: 28/56 Goal status:  IN PROGRESS   10.  Patient will demonstrate at least 19/24 on DGI to decrease risk of falls. Baseline: 9/24 (06/09/22) Goal status: IN PROGRESS   11.  Patient will be able to swing a golf club without loss of balance to allow him to resume playing golf. Baseline: Patient reports LOB leading to falls when swinging golf club. Goal status: IN PROGRESS   PLAN:  PT FREQUENCY: 2x/week  PT DURATION: 8 weeks  PLANNED INTERVENTIONS: Therapeutic exercises, Therapeutic activity, Neuromuscular re-education, Balance training, Gait training,  Patient/Family education, Self Care, Joint mobilization, Stair training, DME instructions, Dry Needling, Electrical stimulation, Spinal mobilization, Taping, Manual therapy, and Re-evaluation  PLAN FOR NEXT SESSION: create initial HEP - LE flexibility and strengthening incorporating functional movement patterns; gait training with SPC versus RW to determine safest level of assistive device   Marry Guan, PT 06/09/2022, 12:44 PM

## 2022-06-10 ENCOUNTER — Other Ambulatory Visit: Payer: Self-pay

## 2022-06-10 ENCOUNTER — Encounter (HOSPITAL_BASED_OUTPATIENT_CLINIC_OR_DEPARTMENT_OTHER): Payer: Self-pay

## 2022-06-10 ENCOUNTER — Emergency Department (HOSPITAL_BASED_OUTPATIENT_CLINIC_OR_DEPARTMENT_OTHER)
Admission: EM | Admit: 2022-06-10 | Discharge: 2022-06-10 | Disposition: A | Discharge status: Home / Self Care | Payer: Medicare HMO | Attending: Emergency Medicine | Admitting: Emergency Medicine

## 2022-06-10 DIAGNOSIS — R350 Frequency of micturition: Secondary | ICD-10-CM | POA: Insufficient documentation

## 2022-06-10 DIAGNOSIS — N183 Chronic kidney disease, stage 3 unspecified: Secondary | ICD-10-CM | POA: Insufficient documentation

## 2022-06-10 DIAGNOSIS — I129 Hypertensive chronic kidney disease with stage 1 through stage 4 chronic kidney disease, or unspecified chronic kidney disease: Secondary | ICD-10-CM | POA: Insufficient documentation

## 2022-06-10 DIAGNOSIS — Z7951 Long term (current) use of inhaled steroids: Secondary | ICD-10-CM | POA: Insufficient documentation

## 2022-06-10 DIAGNOSIS — J45909 Unspecified asthma, uncomplicated: Secondary | ICD-10-CM | POA: Insufficient documentation

## 2022-06-10 DIAGNOSIS — Z8546 Personal history of malignant neoplasm of prostate: Secondary | ICD-10-CM | POA: Diagnosis not present

## 2022-06-10 DIAGNOSIS — Z79899 Other long term (current) drug therapy: Secondary | ICD-10-CM | POA: Insufficient documentation

## 2022-06-10 DIAGNOSIS — R300 Dysuria: Secondary | ICD-10-CM | POA: Insufficient documentation

## 2022-06-10 LAB — URINALYSIS, W/ REFLEX TO CULTURE (INFECTION SUSPECTED)
Bilirubin Urine: NEGATIVE
Glucose, UA: NEGATIVE mg/dL
Hgb urine dipstick: NEGATIVE
Ketones, ur: NEGATIVE mg/dL
Leukocytes,Ua: NEGATIVE
Nitrite: NEGATIVE
Protein, ur: NEGATIVE mg/dL
RBC / HPF: NONE SEEN RBC/hpf (ref 0–5)
Specific Gravity, Urine: 1.02 (ref 1.005–1.030)
pH: 6.5 (ref 5.0–8.0)

## 2022-06-10 NOTE — ED Provider Notes (Addendum)
[facility] Provider Note   CSN: [id number] Arrival date & time: 06/10/22  0900     History  Chief Complaint  Patient presents with  . Dysuria    Calvin Bautista is a 82 y.o. male with asthma, HTN, GAD, OAB, h/o prostate cancer, CKD stage 3, atrial fibrillation, obesity who presents with increased frequency with urination and burning with urination on and off for a couple of months he has a history of overactive bladder for which he takes tolterodine, which she says helps the symptoms but he wonders if the dose is not high enough.  Denies fevers/chills, abdominal pain, penile discharge, hematuria, testicular or scrotal pain or swelling.  He does have a history of prostate cancer that was resected many years ago.  He has not been sexually active as a result and does not have any concern for STDs.  He does not have any blood thinners.  He has not seen a urologist since his prostate cancer many years ago.  HPI     Home Medications Prior to Admission medications   Medication Sig Start Date End Date Taking? Authorizing Provider  albuterol (VENTOLIN HFA) 108 (90 Base) MCG/ACT inhaler Inhale 2 puffs into the lungs every 6 (six) hours as needed. 12/20/19   [provider]  azelastine (ASTELIN) 0.1 % nasal spray Place 1 spray into both nostrils 2 (two) times daily. 04/27/20   [provider]  diltiazem (TIAZAC) 240 MG 24 hr capsule Take 240 mg by mouth daily. 02/08/21 02/21/23  [provider]  fexofenadine (ALLEGRA) 180 MG tablet Take 180 mg by mouth daily.    [provider]  Fluticasone-Umeclidin-Vilant (TRELEGY ELLIPTA) 200-62.5-25 MCG/ACT AEPB Inhale 1 puff daily. 02/21/22   Sharlene Dory, DO  losartan (COZAAR) 50 MG tablet Take 50 mg by mouth daily. 02/09/21   [provider]  montelukast (SINGULAIR) 10 MG tablet Take 10 mg by mouth at bedtime. 01/09/21   [provider]  tolterodine  (DETROL LA) 4 MG 24 hr capsule Take 4 mg by mouth daily. 12/24/21   [provider]      Allergies    Iodine    Review of Systems   Review of Systems Review of systems Negative for f/c.  A 10 point review of systems was performed and is negative unless otherwise reported in HPI.  Physical Exam Updated Vital Signs BP (!) 142/92   Pulse 88   Temp (!) 97.5 F (36.4 C) (Oral)   Resp 20   Ht 5\' 11"  (1.803 m)   Wt 108.9 kg   SpO2 99%   BMI 33.47 kg/m  Physical Exam General: Normal appearing male, lying in bed.  HEENT: Sclera anicteric, MMM, trachea midline.  Cardiology: RRR, no murmurs/rubs/gallops. BL radial and DP pulses equal bilaterally.  Resp: Normal respiratory rate and effort. CTAB, no wheezes, rhonchi, crackles.  Abd: Soft, non-tender, non-distended. No rebound tenderness or guarding.  Midline vertical surgical incision scar on abdomen and suprapubic region. GU: Normal-appearing genitalia. MSK: No peripheral edema or signs of trauma. Extremities without deformity or TTP. No cyanosis or clubbing. Skin: warm, dry. No rashes or lesions. Back: +R CVA tenderness Neuro: A&Ox4, CNs II-XII grossly intact. MAEs. Sensation grossly intact.  Psych: Normal mood and affect.   ED Results / Procedures / Treatments   Labs (all labs ordered are listed, but only abnormal results are displayed) Labs Reviewed  URINALYSIS, W/ REFLEX TO CULTURE (INFECTION SUSPECTED) - Abnormal; Notable for  the following components:      Result Value   Bacteria, UA RARE (*)    All other components within normal limits    EKG None  Radiology No results found.  Procedures Procedures    Medications Ordered in ED Medications - No data to display  ED Course/ Medical Decision Making/ A&P                          Medical Decision Making Amount and/or Complexity of Data Reviewed Labs:  Decision-making details documented in ED Course.    This patient presents to the ED for concern of  urinary frequency/burning, this involves an extensive number of treatment options, and is a complaint that carries with it a high risk of complications and morbidity.  Patient is overall very well-appearing and hemodynamically stable.  Afebrile.  MDM:    Patient with urinary frequency and burning with urination on and off for a couple of months with a history of overactive bladder.  Could have urinary retention and will obtain a PVR.  Patient relays that he thinks his symptoms are due to his overactive bladder.  Also consider UTI/cystitis.  Patient does have some associated right flank pain and positive right CVA tenderness, consider pyelonephritis.  He does not have very severe flank pain, overall lower concern for nephrolithiasis/ureterolithiasis, renal infarction, perinephric abscess.  If he is retaining urine could also have obstructive uropathy.  No abdominal tenderness palpation to indicate abdominal pathology such as biliary disease, appendicitis.  Clinical Course as of 06/10/22 1029  Fri Jun 10, 2022  0952 PVR 0 cc.  No urinary retention noted, unlikely obstructive uropathy. Patient is eating/drinking well, and emptying his bladder, do not believe labs are necessary at this time. [HN]  1022 Urinalysis, w/ Reflex to Culture (Infection Suspected) -Urine, Clean Catch(!) Neg for infection, hematuria, proteinuria.  [HN]    Clinical Course User Index [HN] Loetta Rough, MD    Labs: I Ordered, and personally interpreted labs.  The pertinent results include: Those listed above  Additional history obtained from chart review.   Social Determinants of Health: .Patient lives independently, walks with a cane at baseline  Disposition: Patient with 0 cc on PVR and completely clear UA.  No hematuria or evidence of infection.  In the absence of hematuria I have lower suspicion for nephrolithiasis/ureterolithiasis, and patient's flank pain is so very minimal, likely not consistent with stone  pathology. No pyuria to indicate urethritis. Patient's symptoms are c/w his prior symptoms of chronic OAB. I recommended continuing his home medication and following up with [facility]. Patient reports understanding. DC w/ discharge instructions/return precautions. All questions answered to patient's satisfaction.    Co morbidities that complicate the patient evaluation . Past Medical History:  Diagnosis Date  . Cancer (HCC)   . Hypertension   . Leg weakness, bilateral    left is worse; started around 2014  . Seasonal allergies      Medicines No orders of the defined types were placed in this encounter.   I have reviewed the patients home medicines and have made adjustments as needed  Problem List / ED Course: Problem List Items Addressed This Visit   None Visit Diagnoses     Urinary frequency    -  Primary            Loetta Rough, MD 06/10/22 1029

## 2022-06-10 NOTE — ED Triage Notes (Signed)
Patient stated he has had increased frequency with urination and burning on and off for a month. Hx of UTI. He denied fever.

## 2022-06-10 NOTE — ED Notes (Signed)
Post void residual showed 0ml in bladder. ED provider in room during scan.

## 2022-06-10 NOTE — Discharge Instructions (Signed)
Thank you for coming to [facility] Emergency Department. You were seen for urinary frequency. We did an exam, labs, and these showed no acute findings.  Your symptoms are likely due to your chronic overactive bladder.  For this you can follow-up with [facility].  Please give Dr. Laverle Patter to call at [phone] to make an appointment for follow up within the next 1-2 weeks.  Do not hesitate to return to the ED or call 911 if you experience: -Worsening symptoms -Worsening flank pain or abdominal pain -Inability to urinate -Lightheadedness, passing out -Fevers/chills -Anything else that concerns you

## 2022-06-13 ENCOUNTER — Ambulatory Visit: Payer: Medicare HMO | Attending: Family Medicine

## 2022-06-13 DIAGNOSIS — R2681 Unsteadiness on feet: Secondary | ICD-10-CM | POA: Diagnosis present

## 2022-06-13 DIAGNOSIS — R296 Repeated falls: Secondary | ICD-10-CM | POA: Diagnosis present

## 2022-06-13 DIAGNOSIS — M6281 Muscle weakness (generalized): Secondary | ICD-10-CM | POA: Diagnosis present

## 2022-06-13 NOTE — Therapy (Signed)
OUTPATIENT PHYSICAL THERAPY TREATMENT   Patient Name: Calvin Bautista MRN: [id number] DOB:Aug 21, 1940, 82 y.o., male Today's Date: 06/13/2022   END OF SESSION:  PT End of Session - 06/13/22 1102     Visit Number 3    Date for PT Re-Evaluation 07/28/22    Authorization Type Medicare & [employer]    Progress Note Due on Visit 10    PT Start Time 1019    PT Stop Time 1100    PT Time Calculation (min) 41 min    Activity Tolerance Patient tolerated treatment well    Behavior During Therapy WFL for tasks assessed/performed              Past Medical History:  Diagnosis Date   Cancer (HCC)    Hypertension    Leg weakness, bilateral    left is worse; started around 2014   Seasonal allergies    Past Surgical History:  Procedure Laterality Date   KNEE SURGERY     PROSTATE SURGERY     Patient Active Problem List   Diagnosis Date Noted   Moderate persistent asthma without complication 02/21/2022   Essential hypertension 02/21/2022   GAD (generalized anxiety disorder) 02/21/2022   Weakness of left lower extremity 02/21/2022   OAB (overactive bladder) 02/21/2022   Personal history of prostate cancer 01/25/2021   Unspecified atrial fibrillation (HCC) 07/29/2020   Obesity 07/29/2020   Stage 3a chronic kidney disease (HCC) 09/05/2017   Hyponatremia 08/14/2014    PCP: Sharlene Dory, DO   REFERRING PROVIDER: Sharlene Dory, DO  REFERRING DIAG: [phone] (ICD-10-CM) - Weakness of left lower extremity   THERAPY DIAG:  Unsteadiness on feet  Muscle weakness (generalized)  Repeated falls  RATIONALE FOR EVALUATION AND TREATMENT: Rehabilitation  ONSET DATE: Chronic x 10+ yrs, worsening since fall a few years ago  NEXT MD VISIT: 08/22/22   SUBJECTIVE:                                                                                                                                                                                                         SUBJECTIVE  STATEMENT: Pt reports he wants to get back to golf course. No pain today  PAIN: Are you having pain? 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 Yes. Number of falls 3  LIVING ENVIRONMENT: Lives with: lives with their  spouse Lives in: House/apartment Stairs: No Has following equipment at home: Single point cane, Environmental consultant - 4 wheeled, and shower chair - walk-in shower  OCCUPATION: Retired  PLOF: Independent and Leisure: golf, gym up to 3x/wk  PATIENT GOALS: "To improve my balance so I can play golf again."   OBJECTIVE: (objective measures completed at initial evaluation unless otherwise dated)  DIAGNOSTIC FINDINGS:  11/06/20 - Lumbar MRI: CONCLUSION:  1. There is severe right L5-S1 neural foraminal stenosis, a potential etiology for right L5 nerve root impingement.  There is moderate to severe left L4-L5 neural foraminal stenosis.  There is moderate right L4-L5 neural foraminal stenosis.  There is moderate left L5-S1 neural foraminal stenosis.  2. There is moderate L4-L5 central canal stenosis.  There is mild L3-L4 central canal stenosis.  3. There is multilevel facet arthropathy, most severe at the L4-L5 level, where there is 4 mm anterolisthesis of L4 over L5   PATIENT SURVEYS:  ABC scale 690 / 1600 = 43.1 % LEFS 38 / 80 = 47.5 %  COGNITION: Overall cognitive status: Within functional limits for tasks assessed    SENSATION: WFL  EDEMA:  Mild intermittent edema  MUSCLE LENGTH: Hamstrings: mod tight L, mild tight R ITB: mod tight B Piriformis: mod tight L>R Hip IR: mod tight B Hip flexors: mod/severe tight B Quads: mod tight B Heelcord: NT  POSTURE:  rounded shoulders, forward head, flexed trunk , and weight shift right  LOWER EXTREMITY ROM:  Active ROM  Right eval Left eval  Knee flexion 6 2  Knee extension 115 104   Passive ROM Right eval Left eval  Knee flexion    Knee extension 0 0  (Blank rows = not tested)  LOWER EXTREMITY MMT:  MMT Right eval Left eval  Hip flexion 4+ 4  Hip extension 2+ 3-  Hip abduction 3- 3-  Hip adduction 3- 3-  Hip internal rotation 3+ 4-  Hip external rotation 4- 4-  Knee flexion 4+ 4  Knee extension 4 4  Ankle dorsiflexion 4+ 3+  Ankle plantarflexion 4 (16 SLS HR - limited lift) 4 (13 SLS HR - limited lift)  Ankle inversion    Ankle eversion     (Blank rows = not tested)  LOWER EXTREMITY SPECIAL TESTS:  Hip special tests: Ober's test: positive   FUNCTIONAL TESTS:  5 times sit to stand: 06/09/22: unable w/o UE assist (falling back into chair), 15.31 sec with single UE assist Timed up and go (TUG): 06/09/22: 14.96 sec, >13.5 sec indicates high fall risk 10 meter walk test: 06/09/22: 12.03 sec; Gait speed = 2.73 ft/sec Berg Balance Scale: 28/56; < 36 high risk for falls (close to 100%) Dynamic Gait Index: 06/09/22: 9/24, < 19 = high risk fall  Functional gait assessment: 06/09/22: 9/30, Scores of 19 or less are predicitve of falls in older community living adults.  TRANSFERS: Assistive device utilized: None  Sit to stand: Modified independence - increased weight shift to R LE w/o UE assist Stand to sit: Modified independence - increased weight shift to R LE  Chair to chair: Modified independence Floor:  NT  GAIT: Distance walked: 60 ft Assistive device utilized: None Level of assistance: SBA Gait pattern: step through pattern, decreased arm swing- Left, decreased stride length, decreased hip/knee flexion- Right, decreased hip/knee flexion- Left, decreased trunk rotation, trunk flexed, wide BOS, poor foot clearance- Right, and poor foot clearance- Left Comments: Slow cadence with increased lateral sway   TODAY'S TREATMENT:  06/13/22 Therapeutic  Exercise: to improve strength and mobility.   Demo, verbal and tactile cues throughout for technique.  Nustep L5x32min Standing heel and toe raise 2x10 Standing hip abduction x 10 bil Standing march x 10 bil Standing hip extension x 10 bil Seated ball squeeze x 10 Standing with narrow BOS  Standing with head turns  Standing trunk rotations x 10   GAIT TRAINING: To normalize gait pattern and improve safety. 180 ft - instruction on gait with cane increasing heel strike to decrease fall risk  06/09/22 THERAPEUTIC ACTIVITIES: 5xSTS: unable w/o UE assist (falling back into chair), 15.31 sec with single UE assist TUG: 14.96 sec, >13.5 sec indicates high fall risk  : 12.03 sec Gait speed = 2.73 ft/sec DGI: 9/24, < 19 = high risk fall  FGA: 9/30,  Scores of 19 or less are predicitve of falls in older community living adults. Stairs: Level of Assistance: SBA and CGA/min A Stair Negotiation Technique: Step to Pattern Alternating Pattern  with Single Rail on Left Number of Stairs: 14  Height of Stairs: 7"  Comments: Alternating pattern on a send however patient only placing ball of foot on stairs causing heel to drop over step edge of step and create posterior LOB, requiring CGA/min assist of PT to prevent loss of balance.  Step-to pattern on descent.  SELF CARE: Discussed results of balance testing and implication related to risk for falls, recommending consistent use of AD for ambulation both in and out of home.  Patient reports his wife frequently fusses at him to use his cane, but he admits to poor compliance with this.  He only uses RW for showering. Reviewed the "Check for Safety - Home Fall Prevention Checklist for Older Adults" to help identify fall risk hazards in the home along with strategies to reduce fall risk at home   06/02/22 Eval only   PATIENT EDUCATION:  Education details: HEP update and Check for Safety - Home Fall Prevention Checklist for Older Adults   Person educated: Patient Education method: Explanation,  Verbal cues, and Handouts Education comprehension: verbalized understanding and needs further education  HOME EXERCISE PROGRAM: Access Code: [email or web address] URL: [email or web address] Date: 06/09/2022 Prepared by: Glenetta Hew  Patient Education - Check for Safety Access Code: [email or web address] URL: [email or web address] Date: 06/13/2022 Prepared by: Verta Ellen  Exercises - Heel Raises with Counter Support  - 1 x daily - 7 x weekly - 3 sets - 10 reps - Heel Toe Raises with Counter Support  - 1 x daily - 7 x weekly - 3 sets - 10 reps - Standing Hip Abduction with Counter Support  - 1 x daily - 7 x weekly - 3 sets - 10 reps - Standing March with Counter Support  - 1 x daily - 7 x weekly - 3 sets - 10 reps - Standing Hip Extension with Counter Support  - 1 x daily - 7 x weekly - 3 sets - 10 reps  ASSESSMENT:  CLINICAL IMPRESSION: Advanced through strengthening, balance, and gait training to improve, strength, coordination, and gait pattern. He showed some LOB with standing with his feet together as well as the head turns. I encouraged him to use a cane for more gait stability, he also needs cues for heel strike during gait as he drags his L foot on initial contact. Updated HEP with general hip and LE strengthening to improve balance.     OBJECTIVE IMPAIRMENTS: Abnormal gait, decreased activity tolerance, decreased balance, decreased coordination, decreased knowledge of condition, decreased knowledge of  use of DME, decreased mobility, difficulty walking, decreased ROM, decreased strength, decreased safety awareness, increased fascial restrictions, impaired perceived functional ability, increased muscle spasms, impaired flexibility, improper body mechanics, and postural dysfunction.   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   REHAB POTENTIAL: Good  CLINICAL DECISION MAKING: Evolving/moderate complexity  EVALUATION COMPLEXITY: Moderate   GOALS: Goals reviewed with patient? Yes  SHORT TERM GOALS: Target date: 06/30/2022  Patient will be independent with initial HEP. Baseline: TBD Goal status: IN PROGRESS  2.  Complete balance assessment and update LTG's as indicated. Baseline: 5xSTS, , TUG, DGI vs FGA pending Goal status: MET  3.  Patient will verbalize understanding of fall prevention measures in home to reduce risk for falls. Baseline: Patient experiencing recurrent falls, with at least 3 falls in the past 6 months. Goal status: IN PROGRESS  06/09/22 - discussed need for consistent use of AD for ambulation as well as reviewed the "Check for Safety - Home Fall Prevention Checklist for Older Adults" to help identify fall risk hazards in the home along with strategies to reduce fall risk at home   LONG TERM GOALS: Target date: 07/28/2022  Patient will be independent with advanced/ongoing HEP to improve outcomes and carryover.  Baseline:  Goal status: IN PROGRESS  2.  Patient will demonstrate improved L knee AROM to [facility] to allow for normal gait and stair mechanics. Baseline: L knee flexion limited to 104 Goal status: IN PROGRESS  3.  Patient will demonstrate improved B LE strength to >/= 4 to 4+/5 for improved stability and ease of mobility. Baseline: Refer to above MMT table Goal status: IN PROGRESS  4.  Patient will be able to ambulate 600' with or w/o LRAD and normal gait pattern without evidence of instability to  safely access community.  Baseline:  Goal status: IN PROGRESS  5. Patient will be able to ascend/descend stairs with 1 HR and reciprocal step pattern safely to access home and community.  Baseline: TBA Goal status: IN PROGRESS  6.  Patient will report >/= 47/80 on LEFS to demonstrate improved functional ability. Baseline: 38 / 80 = 47.5 % Goal status: IN PROGRESS  7.   Patient will report >/= 62% on ABC scale to demonstrate improved balance confidence. Baseline: 690 / 1600 = 43.1 % Goal status: IN PROGRESS   8.  Patient will demonstrate decreased TUG time to </= 13.5 sec to decrease risk for falls with transitional mobility. Baseline: 14.96 sec (06/09/22) Goal status: IN PROGRESS   9.  Patient will improve Berg score to >/= 36/56 to improve safety and stability with ADLs in standing and reduce risk for falls. Baseline: 28/56 Goal status: IN PROGRESS   10.  Patient will demonstrate at least 19/24 on DGI to decrease risk of falls. Baseline: 9/24 (06/09/22) Goal status: IN PROGRESS   11.  Patient will be able to swing a golf club without loss of balance to allow him to resume playing golf. Baseline: Patient reports LOB leading to falls when swinging golf club. Goal status: IN PROGRESS   PLAN:  PT FREQUENCY: 2x/week  PT DURATION: 8 weeks  PLANNED INTERVENTIONS: Therapeutic exercises, Therapeutic activity, Neuromuscular re-education, Balance training, Gait training, Patient/Family education, Self Care, Joint mobilization, Stair training, DME instructions, Dry Needling, Electrical stimulation, Spinal mobilization, Taping, Manual therapy, and Re-evaluation  PLAN FOR NEXT SESSION: LE flexibility and strengthening incorporating functional movement patterns; gait training with SPC versus RW to determine safest level of assistive device   Belkys Henault L Hazle Ogburn, PTA 06/13/2022, 11:48 AM

## 2022-06-16 ENCOUNTER — Ambulatory Visit: Payer: Medicare HMO | Admitting: Physical Therapy

## 2022-06-20 ENCOUNTER — Ambulatory Visit: Payer: Medicare HMO | Admitting: Physical Therapy

## 2022-06-20 ENCOUNTER — Encounter: Payer: Self-pay | Admitting: Physical Therapy

## 2022-06-20 DIAGNOSIS — R2681 Unsteadiness on feet: Secondary | ICD-10-CM | POA: Diagnosis not present

## 2022-06-20 DIAGNOSIS — M6281 Muscle weakness (generalized): Secondary | ICD-10-CM

## 2022-06-20 DIAGNOSIS — R296 Repeated falls: Secondary | ICD-10-CM

## 2022-06-20 NOTE — Therapy (Addendum)
OUTPATIENT PHYSICAL THERAPY TREATMENT/Discharge   Patient Name: Calvin Bautista MRN: [id number] DOB:11-23-1940, 82 y.o., male Today's Date: 06/20/2022   END OF SESSION:  PT End of Session - 06/20/22 0933     Visit Number 4    Date for PT Re-Evaluation 07/28/22    Authorization Type Medicare & AARP    Progress Note Due on Visit 10    PT Start Time 0933    PT Stop Time 1020    PT Time Calculation (min) 47 min    Activity Tolerance Patient tolerated treatment well    Behavior During Therapy WFL for tasks assessed/performed              Past Medical History:  Diagnosis Date   Cancer (HCC)    Hypertension    Leg weakness, bilateral    left is worse; started around 2014   Seasonal allergies    Past Surgical History:  Procedure Laterality Date   KNEE SURGERY     PROSTATE SURGERY     Patient Active Problem List   Diagnosis Date Noted   Moderate persistent asthma without complication 02/21/2022   Essential hypertension 02/21/2022   GAD (generalized anxiety disorder) 02/21/2022   Weakness of left lower extremity 02/21/2022   OAB (overactive bladder) 02/21/2022   Personal history of prostate cancer 01/25/2021   Unspecified atrial fibrillation (HCC) 07/29/2020   Obesity 07/29/2020   Stage 3a chronic kidney disease (HCC) 09/05/2017   Hyponatremia 08/14/2014    PCP: Sharlene Dory, DO   REFERRING PROVIDER: Sharlene Dory, DO  REFERRING DIAG: [phone] (ICD-10-CM) - Weakness of left lower extremity   THERAPY DIAG:  Unsteadiness on feet  Muscle weakness (generalized)  Repeated falls  RATIONALE FOR EVALUATION AND TREATMENT: Rehabilitation  ONSET DATE: Chronic x 10+ yrs, worsening since fall a few years ago  NEXT MD VISIT: 08/22/22   SUBJECTIVE:                                                                                                                                                                                                          SUBJECTIVE STATEMENT: Pt reports he goes to the [facility] on days that he does not come to PT. He states he has tried some of the HEP but would like a review.  PAIN: Are you having pain? 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 Yes. Number of falls 3  LIVING ENVIRONMENT: Lives with: lives with their spouse Lives in: House/apartment Stairs: No Has following equipment at home: Single point cane, Environmental consultant - 4 wheeled, and shower chair - walk-in shower  OCCUPATION: Retired  PLOF: Independent and Leisure: golf, gym up to 3x/wk  PATIENT GOALS: "To improve my balance so I can play golf again."   OBJECTIVE: (objective measures completed at initial evaluation unless otherwise dated)  DIAGNOSTIC FINDINGS:  11/06/20 - Lumbar MRI: CONCLUSION:  1. There is severe right L5-S1 neural foraminal stenosis, a potential etiology for right L5 nerve root impingement.  There is moderate to severe left L4-L5 neural foraminal stenosis.  There is moderate right L4-L5 neural foraminal stenosis.  There is moderate left L5-S1 neural foraminal stenosis.  2. There is moderate L4-L5 central canal stenosis.  There is mild L3-L4 central canal stenosis.  3. There is multilevel facet arthropathy, most severe at the L4-L5 level, where there is 4 mm anterolisthesis of L4 over L5   PATIENT SURVEYS:  ABC scale 690 / 1600 = 43.1 % LEFS 38 / 80 = 47.5 %  COGNITION: Overall cognitive status: Within functional limits for tasks assessed    SENSATION: WFL  EDEMA:  Mild intermittent edema  MUSCLE LENGTH: Hamstrings: mod tight L, mild tight R ITB: mod tight B Piriformis: mod tight L>R Hip IR: mod tight B Hip flexors: mod/severe tight B Quads: mod tight B Heelcord: NT  POSTURE:  rounded shoulders,  forward head, flexed trunk , and weight shift right  LOWER EXTREMITY ROM:  Active ROM Right eval Left eval  Knee flexion 6 2  Knee extension 115 104   Passive ROM Right eval Left eval  Knee flexion    Knee extension 0 0  (Blank rows = not tested)  LOWER EXTREMITY MMT:  MMT Right eval Left eval  Hip flexion 4+ 4  Hip extension 2+ 3-  Hip abduction 3- 3-  Hip adduction 3- 3-  Hip internal rotation 3+ 4-  Hip external rotation 4- 4-  Knee flexion 4+ 4  Knee extension 4 4  Ankle dorsiflexion 4+ 3+  Ankle plantarflexion 4 (16 SLS HR - limited lift) 4 (13 SLS HR - limited lift)  Ankle inversion    Ankle eversion     (Blank rows = not tested)  LOWER EXTREMITY SPECIAL TESTS:  Hip special tests: Ober's test: positive   FUNCTIONAL TESTS:  5 times sit to stand: 06/09/22: unable w/o UE assist (falling back into chair), 15.31 sec with single UE assist Timed up and go (TUG): 06/09/22: 14.96 sec, >13.5 sec indicates high fall risk 10 meter walk test: 06/09/22: 12.03 sec; Gait speed = 2.73 ft/sec Berg Balance Scale: 28/56; < 36 high risk for falls (close to 100%) Dynamic Gait Index: 06/09/22: 9/24, < 19 = high risk fall  Functional gait assessment: 06/09/22: 9/30, Scores of 19 or less are predicitve of falls in older community living adults.  TRANSFERS: Assistive device utilized: None  Sit to stand: Modified independence - increased weight shift to R LE w/o UE assist Stand to sit: Modified independence - increased weight shift to R LE  Chair to chair: Modified independence Floor:  NT  GAIT: Distance walked: 60 ft Assistive device utilized: None Level of assistance: SBA Gait pattern: step through pattern, decreased arm swing- Left, decreased stride length, decreased hip/knee flexion- Right, decreased hip/knee flexion- Left, decreased trunk rotation, trunk flexed, wide BOS, poor foot clearance- Right, and poor  foot clearance- Left Comments: Slow cadence with increased lateral  sway   TODAY'S TREATMENT:   06/20/22 THERAPEUTIC EXERCISE: to improve flexibility, strength and mobility.  Verbal and tactile cues throughout for technique. NuStep - L5 x 6 min Standing heel-toe raises at counter 3 x 10, cues to avoid pushing hips back during toe raises Standing hip abduction 2 x 10 bil Standing hip extension 2 x 10 bil, cues for upright posture and to pause before changing direction to reduce reliance on momentum Standing march 2 x 10 bil Postural correction with cervical and scapular retraction + long arm ER into wall 2 x 10 Fwd step-up to 6" step x 10 bil with SBA of PT, cues for upright posture and placement of full foot on step  NEUROMUSCULAR RE-EDUCATION: To improve posture, balance, proprioception, and reduce fall risk. Corner balance progression with narrow BOS on firm surface with arms crossed on chest.            Eyes open: - static stance x 30 sec - horiz head turns x 5 - vertical head nods x 5 - trunk rotation x 5 Eyes closed: - static stance x 2 sec - almost immediate LOB  GAIT TRAINING: To normalize gait pattern and improve safety with SPC . 270 ft with [facility] - cues for proper sequencing with SPC as well as upright posture and fwd gaze rather than looking down at floor   06/13/22 Therapeutic Exercise: to improve strength and mobility.  Demo, verbal and tactile cues throughout for technique.  Nustep L5x55min Standing heel and toe raise 2x10 Standing hip abduction x 10 bil Standing march x 10 bil Standing hip extension x 10 bil Seated ball squeeze x 10 Standing with narrow BOS  Standing with head turns  Standing trunk rotations x 10   GAIT TRAINING: To normalize gait pattern and improve safety. 180 ft - instruction on gait with cane increasing heel strike to decrease fall risk   06/09/22 THERAPEUTIC ACTIVITIES: 5xSTS: unable w/o UE assist (falling back into chair), 15.31 sec with single UE assist TUG: 14.96 sec, >13.5 sec indicates high fall risk   : 12.03 sec Gait speed = 2.73 ft/sec DGI: 9/24, < 19 = high risk fall  FGA: 9/30,  Scores of 19 or less are predicitve of falls in older community living adults. Stairs: Level of Assistance: SBA and CGA/min A Stair Negotiation Technique: Step to Pattern Alternating Pattern  with Single Rail on Left Number of Stairs: 14  Height of Stairs: 7"  Comments: Alternating pattern on ascent however patient only placing ball of foot on stairs causing heel to drop over step edge of step and create posterior LOB, requiring CGA/min assist of PT to prevent loss of balance.  Step-to pattern on descent.  SELF CARE: Discussed results of balance testing and implication related to risk for falls, recommending consistent use of AD for ambulation both in and out of home.  Patient reports his wife frequently fusses at him to use his cane, but he admits to poor compliance with this.  He only uses RW for showering. Reviewed the "Check for Safety - Home Fall Prevention Checklist for Older Adults" to help identify fall risk hazards in the home along with strategies to reduce fall risk at home   PATIENT EDUCATION:  Education details: HEP review  Person educated: Patient Education method: Explanation, Verbal cues, and Handouts Education comprehension: verbalized understanding and needs further education  HOME EXERCISE PROGRAM: Access Code: [email or web address] URL: [email or web address] Date: 06/20/2022 Prepared  by: Glenetta Hew  Exercises - Heel Toe Raises with Counter Support  - 1 x daily - 7 x weekly - 3 sets - 10 reps - 3 sec hold - Standing Hip Abduction with Counter Support  - 1 x daily - 7 x weekly - 3 sets - 10 reps - Standing March with Counter Support  - 1 x daily - 7 x weekly - 3 sets - 10 reps - Standing Hip Extension with Counter Support  - 1 x daily - 7 x weekly - 3 sets - 10 reps - Standing Anatomical Position with Scapular Retraction and Depression at Wall  - 1 x daily - 7 x weekly - 3  sets - 10 reps - 3 sec hold  Patient Education - Check for Safety   ASSESSMENT:  CLINICAL IMPRESSION: Calvin Bautista reports he has been working on the HEP but would like to review the exercises today. HEP review demonstrating good recall of most exercises, but frequent cues necessary for posture and pacing during the exercises to minimize reliance on use of momentum and better target the desired muscle activity. He persisted with the flexed head and shoulder posture during balance and gait training as well, therefore postural correction exercises initiated with back on wall and added to HEP. He demonstrated impaired coordination and decreased balance on step-ups requiring SBA of PT for safety. He will not be returning to PT for almost 1 month due to personal travel and conflicts with PT schedule, therefore encouraged to continue with HEP as reviewed today as well as regular use of cane for gait stability.  Calvin Bautista will continue to benefit from skilled PT to address postural, strength and balance deficits to improve mobility and activity tolerance with decreased risk for falls.   OBJECTIVE IMPAIRMENTS: Abnormal gait, decreased activity tolerance, decreased balance, decreased coordination, decreased knowledge of condition, decreased knowledge of use of DME, decreased mobility, difficulty walking, decreased ROM, decreased strength, decreased safety awareness, increased fascial restrictions, impaired perceived functional ability, increased muscle spasms, impaired flexibility, improper body mechanics, and postural dysfunction.   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   REHAB POTENTIAL: Good  CLINICAL DECISION MAKING: Evolving/moderate complexity  EVALUATION COMPLEXITY: Moderate   GOALS: Goals reviewed with patient? Yes  SHORT TERM GOALS: Target date: 06/30/2022  Patient will be independent with initial HEP. Baseline: TBD Goal status: IN PROGRESS  06/20/22 - reviewed with cues necessary for postural alignment and pacing  2.  Complete balance assessment and update LTG's as indicated. Baseline: 5xSTS, , TUG, DGI vs FGA pending Goal status: MET  06/09/22  3.  Patient will verbalize understanding of fall prevention measures in home to reduce risk for falls. Baseline: Patient experiencing recurrent falls, with at least 3 falls in the past 6 months. Goal status: IN PROGRESS  06/09/22 - discussed need for consistent use of AD for ambulation as well as reviewed the "Check for Safety - Home Fall Prevention Checklist for Older Adults" to help identify fall risk hazards in the home along with strategies to reduce fall risk at home   LONG TERM GOALS: Target date:  07/28/2022  Patient will be independent with advanced/ongoing HEP to improve outcomes and carryover.  Baseline:  Goal status: IN PROGRESS  2.  Patient will demonstrate improved L knee AROM to [facility] to allow for normal gait and stair mechanics. Baseline: L knee flexion limited to 104 Goal status: IN PROGRESS  3.  Patient will demonstrate improved B LE strength to >/= 4 to 4+/5 for improved stability and ease of mobility. Baseline: Refer to above MMT table Goal status: IN PROGRESS  4.  Patient will be able to ambulate 600' with or w/o LRAD and normal gait pattern without evidence of instability to safely access community.  Baseline:  Goal status: IN PROGRESS  5. Patient will be able to ascend/descend  stairs with 1 HR and reciprocal step pattern safely to access home and community.  Baseline: TBA Goal status: IN PROGRESS  6.  Patient will report >/= 47/80 on LEFS to demonstrate improved functional ability. Baseline: 38 / 80 = 47.5 % Goal status: IN PROGRESS  7.   Patient will report >/= 62% on ABC scale to demonstrate improved balance confidence. Baseline: 690 / 1600 = 43.1 % Goal status: IN PROGRESS   8.  Patient will demonstrate decreased TUG time to </= 13.5 sec to decrease risk for falls with transitional mobility. Baseline: 14.96 sec (06/09/22) Goal status: IN PROGRESS   9.  Patient will improve Berg score to >/= 36/56 to improve safety and stability with ADLs in standing and reduce risk for falls. Baseline: 28/56 Goal status: IN PROGRESS   10.  Patient will demonstrate at least 19/24 on DGI to decrease risk of falls. Baseline: 9/24 (06/09/22) Goal status: IN PROGRESS   11.  Patient will be able to swing a golf club without loss of balance to allow him to resume playing golf. Baseline: Patient reports LOB leading to falls when swinging golf club. Goal status: IN PROGRESS   PLAN:  PT FREQUENCY: 2x/week  PT DURATION: 8 weeks  PLANNED INTERVENTIONS: Therapeutic exercises, Therapeutic activity, Neuromuscular re-education, Balance training, Gait training, Patient/Family education, Self Care, Joint mobilization, Stair training, DME instructions, Dry Needling, Electrical stimulation, Spinal mobilization, Taping, Manual therapy, and Re-evaluation  PLAN FOR NEXT SESSION: LE flexibility and strengthening incorporating functional movement patterns; postural awareness/correction; balance training; gait training with SPC versus RW to determine safest level of assistive device   Marry Guan, PT 06/20/2022, 12:18 PM  PHYSICAL THERAPY DISCHARGE SUMMARY  Visits from Start of Care: 4  Current functional level related to goals / functional outcomes: See above   Remaining  deficits: See above    Education / Equipment: HEP  Plan: Patient is being discharged due to not being seen for 30+ days.  He would require new order to return to PT at this time.      Jena Gauss, PT  08/24/2022 4:03 PM

## 2022-06-29 ENCOUNTER — Ambulatory Visit: Payer: Medicare HMO | Admitting: Physical Therapy

## 2022-06-30 ENCOUNTER — Encounter: Payer: Medicare Other | Admitting: Physical Therapy

## 2022-07-13 ENCOUNTER — Encounter: Payer: Self-pay | Admitting: Physical Therapy

## 2022-07-19 ENCOUNTER — Ambulatory Visit: Payer: Medicare HMO | Attending: Family Medicine

## 2022-07-21 ENCOUNTER — Ambulatory Visit: Payer: Medicare HMO | Admitting: Physical Therapy

## 2022-07-27 ENCOUNTER — Encounter: Payer: Self-pay | Admitting: Family Medicine

## 2022-07-27 ENCOUNTER — Ambulatory Visit (INDEPENDENT_AMBULATORY_CARE_PROVIDER_SITE_OTHER): Payer: Medicare HMO | Admitting: Family Medicine

## 2022-07-27 VITALS — BP 132/84 | HR 74 | Temp 98.5°F | Resp 20 | Ht 71.0 in | Wt 251.0 lb

## 2022-07-27 DIAGNOSIS — R2689 Other abnormalities of gait and mobility: Secondary | ICD-10-CM

## 2022-07-27 DIAGNOSIS — N3281 Overactive bladder: Secondary | ICD-10-CM

## 2022-07-27 DIAGNOSIS — S76312A Strain of muscle, fascia and tendon of the posterior muscle group at thigh level, left thigh, initial encounter: Secondary | ICD-10-CM | POA: Diagnosis not present

## 2022-07-27 MED ORDER — TOLTERODINE TARTRATE ER 4 MG PO CP24: 4.0000 mg | ORAL_CAPSULE | Freq: Every day | ORAL | 3 refills | Status: DC

## 2022-07-27 NOTE — Patient Instructions (Addendum)
I would like you to add some weight to the leg press.  Please start doing leg extensions and leg curls. Start very light with these.  Also add hip adductors and hip abductor machine workouts.   If you want to see a urologist, please let me know.  Please consider counseling. Contact [phone] to schedule an appointment or inquire about cost/insurance coverage.  [facility] located at [address], [place], [place].  Phone number = [phone].  Dr. Regan Lemming - Adult Psychiatry.    [facility] located at [address] [place], [place], [place]. Phone number = [phone].   [facility] located at [address], [place], [place].  Phone number = [phone].   [facility] located at [address] [place], [place], [place].  Phone number = [phone].  Let us know if you need anything.  Semimembranosus Tendinitis Rehab  It is normal to feel mild stretching, pulling, tightness, or discomfort as you do these exercises, but you should stop right away if you feel sudden pain or your pain gets worse.  Stretching and range of motion exercises These exercises warm up your muscles and joints and improve the movement and flexibility of your thigh. These exercises also help to relieve pain, numbness, and tingling. Exercise A: Hamstring stretch, supine    Lie on your back. Loop a belt or towel across the ball of your left / right foot The ball of your foot is on the walking surface, right under your toes. Straighten your left / right knee and slowly pull on the belt to raise your leg. Stop when you feel a gentle stretch behind your left / right knee or thigh. Do not allow the knee to bend. Keep your other leg flat on the floor. Hold this position for 30 seconds. Repeat 2 times. Complete this exercise 3 times a week. Strengthening exercises These exercises build strength and endurance in your thigh. Endurance is the  ability to use your muscles for a long time, even after they get tired. Exercise B: Straight leg raises (hip extensors) Lie on your belly on a bed or a firm surface with a pillow under your hips. Bend your left / right knee so your foot is straight up in the air. Squeeze your buttock muscles and lift your left / right thigh off the bed. Do not let your back arch. Hold this position for 3 seconds. Slowly return to the starting position. Let your muscles relax completely before you do another repetition. Repeat 2 times. Complete this exercise 3 times a week. Exercise C: Bridge (hip extensors)     Lie on your back on a firm surface with your knees bent and your feet flat on the floor. Tighten your buttocks muscles and lift your bottom off the floor until your trunk is level with your thighs. You should feel the muscles working in your buttocks and the back of your thighs. If you do not feel these muscles, slide your feet 1-2 inches (2.5-5 cm) farther away from your buttocks. Do not arch your back. Hold this position for 3 seconds. Slowly lower your hips to the starting position. Let your buttocks muscles relax completely between repetitions. If this exercise is too easy, try doing it with your arms crossed over your chest. Repeat 2 times. Complete this exercise 3 times a week. Exercise D: Hamstring eccentric, prone Lie on your belly on a bed or on the floor. Start with your legs straight. [employer]  your legs at the ankles with your left / right leg on top. Using your bottom leg to do the work, bend both knees. Using just your left / right leg alone, slowly lower your leg back down toward the bed. Add a 5 lb weight as told by your health care provider. Let your muscles relax completely between repetitions. Repeat 2 times. Complete this exercise 3 times a week. Exercise E: Squats Stand in front of a table, with your feet and knees pointing straight ahead. You may rest your hands on the table for  balance but not for support. Slowly bend your knees and lower your hips like you are going to sit in a chair. Keep your thighs straight or pointed slightly outward. Keep your weight over your heels, not over your toes. Keep your lower legs upright so they are parallel with the table legs. Do not let your hips go lower than your knees. Stop when your knees are bent to the shape of an upside-down letter "L" (90 degree angle). Do not bend lower than told by your health care provider. If your knee pain increases, do not bend as low. Hold the squat position 1-2 seconds. Slowly push with your legs to return to standing. Do not use your hands to pull yourself to standing. Repeat 2 times. Complete this exercise 3 times a week. Make sure you discuss any questions you have with your health care provider. Document Released: 12/27/2004 Document Revised: 09/03/2015 Document Reviewed: 09/30/2014 Elsevier Interactive Patient Education  [employer].

## 2022-07-27 NOTE — Progress Notes (Signed)
Musculoskeletal Exam  Patient: Calvin Bautista DOB: 10/08/1940  DOS: 07/27/2022  SUBJECTIVE:  Chief Complaint:   Chief Complaint  Patient presents with   Follow-up    Calvin Bautista is a 82 y.o.  male for evaluation and treatment of L posterior knee pain. Here w wife.   Onset:  several years ago. Seems to have steadily started 11-12 yrs ago after having knee replacements.  No recent injury or change activity. Location: posterior L knee Character:  aching  Progression of issue:  has worsened Associated symptoms: Weakness/unsteadiness in both legs, some swelling above ankles Treatment: to date has been ice.   Neurovascular symptoms: no  Patient has a history of overactive bladder.  He is currently on tolterodine 4 mg daily.  He has been out of it for the last month.  He has been urinating much more frequently.  Even when he is on it, he will urinate frequently.  He wears depends but does not like to urinate and that.  He denies any chronic/consistent constipation, bleeding, discharge, fevers, abdominal pain.  His prostate has been removed.  Past Medical History:  Diagnosis Date   Cancer (HCC)    Hypertension    Leg weakness, bilateral    left is worse; started around 2014   Seasonal allergies     Objective: VITAL SIGNS: BP 132/84 (BP Location: Left Arm, Cuff Size: Normal)   Pulse 74   Temp 98.5 F (36.9 C) (Oral)   Resp 20   Ht 5\' 11"  (1.803 m)   Wt 251 lb (113.9 kg)   SpO2 95%   BMI 35.01 kg/m  Constitutional: Well formed, well developed. No acute distress. Heart: RRR, 1+ pitting edema tapering at the knees bilaterally Thorax & Lungs: CTAB. No accessory muscle use Musculoskeletal: L knee.   Normal active range of motion: yes.   Normal passive range of motion: yes Tenderness to palpation: Yes over the distal left hamstring Deformity: no Ecchymosis: no Tests positive: None Tests negative: Straight leg Hamstrings tight bilaterally No calf TTP Neurologic: Normal  sensory function. No focal deficits noted. DTR's equal and symmetric in LE's. No clonus.  5/5 strength in hip flexion, knee extension bilaterally, 4/5 strength with knee flexion bilaterally.  Gait is slow/cautious. Abd: BS+, S, NT, mildly distended. Psychiatric: Normal mood. Age appropriate judgment and insight. Alert & oriented x 3.    Assessment:  Balance problem  OAB (overactive bladder) - Plan: tolterodine (DETROL LA) 4 MG 24 hr capsule  Strain of left hamstring, initial encounter  Plan: He has gone through physical therapy.  I think he needs to start doing more lower extremity exercises.  He will start doing hip abduction/adduction, knee extension/curls.  He will continue to do leg press but needs to increase weight. He will go back on Detrol LA 4 mg daily.  I did offer him a referral to the urology team which he politely declined at this time.  I told him to get have his wife send me a message or call in should he change his mind.  Encouraged to make sure bowels are normal. Stretches and exercises.  This should help with his lower extremity strength as well.  Heat, ice, Tylenol. F/u in 1 month to recheck. The patient and his wife voiced understanding and agreement to the plan.  I spent 43 minutes with the patient discussing the above plans in addition to reviewing his chart on the same day of the visit.   Jilda Roche Thornton, DO 07/27/22  2:16  PM

## 2022-07-28 ENCOUNTER — Encounter: Payer: Self-pay | Admitting: Physical Therapy

## 2022-08-11 ENCOUNTER — Other Ambulatory Visit: Payer: Self-pay | Admitting: Family Medicine

## 2022-08-11 ENCOUNTER — Encounter: Payer: Self-pay | Admitting: Family Medicine

## 2022-08-11 DIAGNOSIS — J45909 Unspecified asthma, uncomplicated: Secondary | ICD-10-CM

## 2022-08-11 DIAGNOSIS — I10 Essential (primary) hypertension: Secondary | ICD-10-CM

## 2022-08-11 MED ORDER — LOSARTAN POTASSIUM 50 MG PO TABS
50.0000 mg | ORAL_TABLET | Freq: Every day | ORAL | 0 refills | Status: DC
Start: 2022-08-11 — End: 2022-11-18

## 2022-08-22 ENCOUNTER — Ambulatory Visit: Payer: Medicare Other | Admitting: Family Medicine

## 2022-08-31 ENCOUNTER — Encounter: Payer: Self-pay | Admitting: Family Medicine

## 2022-08-31 ENCOUNTER — Ambulatory Visit (INDEPENDENT_AMBULATORY_CARE_PROVIDER_SITE_OTHER): Payer: 59 | Admitting: Family Medicine

## 2022-08-31 VITALS — BP 121/72 | HR 105 | Temp 98.7°F | Ht 71.0 in | Wt 249.0 lb

## 2022-08-31 DIAGNOSIS — F339 Major depressive disorder, recurrent, unspecified: Secondary | ICD-10-CM

## 2022-08-31 DIAGNOSIS — J45909 Unspecified asthma, uncomplicated: Secondary | ICD-10-CM

## 2022-08-31 DIAGNOSIS — J4540 Moderate persistent asthma, uncomplicated: Secondary | ICD-10-CM

## 2022-08-31 DIAGNOSIS — F411 Generalized anxiety disorder: Secondary | ICD-10-CM | POA: Diagnosis not present

## 2022-08-31 MED ORDER — TRELEGY ELLIPTA 200-62.5-25 MCG/ACT IN AEPB: INHALATION_SPRAY | RESPIRATORY_TRACT | 11 refills | Status: DC

## 2022-08-31 MED ORDER — SERTRALINE HCL 25 MG PO TABS
25.0000 mg | ORAL_TABLET | Freq: Every day | ORAL | 3 refills | Status: DC
Start: 2022-08-31 — End: 2022-09-23

## 2022-08-31 MED ORDER — MONTELUKAST SODIUM 10 MG PO TABS: 10.0000 mg | ORAL_TABLET | Freq: Every day | ORAL | 2 refills | Status: DC

## 2022-08-31 MED ORDER — ALBUTEROL SULFATE HFA 108 (90 BASE) MCG/ACT IN AERS
2.0000 | INHALATION_SPRAY | Freq: Four times a day (QID) | RESPIRATORY_TRACT | 2 refills | Status: DC | PRN
Start: 2022-08-31 — End: 2022-12-16

## 2022-08-31 NOTE — Progress Notes (Signed)
Chief Complaint  Patient presents with   Follow-up    Subjective: Patient is a 82 y.o. male here for f/u.  He is here with his wife.  He started lifting legs more and his balance has improved.  It is still not ideal but he is having more better days than bad.  He started taking his overactive bladder medication that is significantly resolved his symptoms.  He is compliant with no adverse effects.  Patient has a several year history of depression.  Things got worse after his brother died.  He will sometimes cry, have irritability, have memory issues, and have loss of interest in doing things.  He is not following with a therapist.  No thoughts of harming himself or others.  No self-medication.  He is not currently on medication.  Several months of bilateral low back pain.  He wonders if his kidneys are affected.  No blood in his urine visually.  Previous workup in December 2023 is unremarkable.  There was no blood in his urine from a microscopic standpoint and his renal function was sound for his age.  Past Medical History:  Diagnosis Date   Cancer (HCC)    Hypertension    Leg weakness, bilateral    left is worse; started around 2014   Seasonal allergies     Objective: BP 121/72 (BP Location: Right Arm, Patient Position: Sitting, Cuff Size: Large)   Pulse (!) 105   Temp 98.7 F (37.1 C) (Oral)   Ht 5\' 11"  (1.803 m)   Wt 249 lb (112.9 kg)   SpO2 96%   BMI 34.73 kg/m  General: Awake, appears stated age MSK: Hypertonicity of the lumbar paraspinal musculature without TTP, no midline tenderness, no deformity, tight hamstrings bilaterally with a negative straight leg Neuro: Gait is cautious and slow Lungs: No accessory muscle use Psych: Age appropriate judgment and insight, normal affect and mood  Assessment and Plan: Depression, recurrent (HCC) - Plan: sertraline (ZOLOFT) 25 MG tablet  Moderate persistent asthma without complication - Plan: albuterol (VENTOLIN HFA) 108 (90 Base)  MCG/ACT inhaler, Fluticasone-Umeclidin-Vilant (TRELEGY ELLIPTA) 200-62.5-25 MCG/ACT AEPB  Asthma due to environmental allergies - Plan: montelukast (SINGULAIR) 10 MG tablet  GAD (generalized anxiety disorder) - Plan: sertraline (ZOLOFT) 25 MG tablet  Anxiety/depression: Chronic, uncontrolled.  Counseling information provided.  Counseled on exercise.  Start Zoloft 25 mg daily.  Follow-up in 6 weeks. For his low back pain, he was given stretches and exercises.  He, ice, Tylenol. Need to take his Trelegy daily. The patient and his spouse voiced understanding and agreement to the plan.  Jilda Roche [place], DO 08/31/22  3:40 PM

## 2022-08-31 NOTE — Patient Instructions (Addendum)
Stay active.  Heat (pad or rice pillow in microwave) over affected area, 10-15 minutes twice daily.   Ice/cold pack over area for 10-15 min twice daily.  OK to take Tylenol 1000 mg (2 extra strength tabs) or 975 mg (3 regular strength tabs) every 6 hours as needed.  Please consider counseling. Contact [phone] to schedule an appointment or inquire about cost/insurance coverage.  [facility] located at [address], [place], [place].  Phone number = [phone].  Dr. Regan Lemming - Adult Psychiatry.    [facility] located at [address] [place], [place], [place]. Phone number = [phone].   [facility] located at [address] [place], [place].  Phone number = [phone].   [facility] located at [address] [place], [place], [place].  Phone number = [phone].  Let us know if you need anything.  EXERCISES  RANGE OF MOTION (ROM) AND STRETCHING EXERCISES - Low Back Pain Most people with lower back pain will find that their symptoms get worse with excessive bending forward (flexion) or arching at the lower back (extension). The exercises that will help resolve your symptoms will focus on the opposite motion.  If you have pain, numbness or tingling which travels down into your buttocks, leg or foot, the goal of the therapy is for these symptoms to move closer to your back and eventually resolve. Sometimes, these leg symptoms will get better, but your lower back pain may worsen. This is often an indication of progress in your rehabilitation. Be very alert to any changes in your symptoms and the activities in which you participated in the 24 hours prior to the change. Sharing this information with your caregiver will allow him or her to most efficiently treat your condition. These exercises may help you when beginning to rehabilitate your injury. Your symptoms may resolve with or without further involvement from  your physician, physical therapist or athletic trainer. While completing these exercises, remember:  Restoring tissue flexibility helps normal motion to return to the joints. This allows healthier, less painful movement and activity. An effective stretch should be held for at least 30 seconds. A stretch should never be painful. You should only feel a gentle lengthening or release in the stretched tissue. FLEXION RANGE OF MOTION AND STRETCHING EXERCISES:  STRETCH - Flexion, Single Knee to Chest  Lie on a firm bed or floor with both legs extended in front of you. Keeping one leg in contact with the floor, bring your opposite knee to your chest. Hold your leg in place by either grabbing behind your thigh or at your knee. Pull until you feel a gentle stretch in your low back. Hold 30 seconds. Slowly release your grasp and repeat the exercise with the opposite side. Repeat 2 times. Complete this exercise 3 times per week.   STRETCH - Flexion, Double Knee to Chest Lie on a firm bed or floor with both legs extended in front of you. Keeping one leg in contact with the floor, bring your opposite knee to your chest. Tense your stomach muscles to support your back and then lift your other knee to your chest. Hold your legs in place by either grabbing behind your thighs or at your knees. Pull both knees toward your chest until you feel a gentle stretch in your low back. Hold 30 seconds. Tense your stomach muscles and slowly return one leg at a time to the floor. Repeat 2 times. Complete this exercise 3 times  per week.   STRETCH - Low Trunk Rotation Lie on a firm bed or floor. Keeping your legs in front of you, bend your knees so they are both pointed toward the ceiling and your feet are flat on the floor. Extend your arms out to the side. This will stabilize your upper body by keeping your shoulders in contact with the floor. Gently and slowly drop both knees together to one side until you feel a gentle  stretch in your low back. Hold for 30 seconds. Tense your stomach muscles to support your lower back as you bring your knees back to the starting position. Repeat the exercise to the other side. Repeat 2 times. Complete this exercise at least 3 times per week.   EXTENSION RANGE OF MOTION AND FLEXIBILITY EXERCISES:  STRETCH - Extension, Prone on Elbows  Lie on your stomach on the floor, a bed will be too soft. Place your palms about shoulder width apart and at the height of your head. Place your elbows under your shoulders. If this is too painful, stack pillows under your chest. Allow your body to relax so that your hips drop lower and make contact more completely with the floor. Hold this position for 30 seconds. Slowly return to lying flat on the floor. Repeat 2 times. Complete this exercise 3 times per week.   RANGE OF MOTION - Extension, Prone Press Ups Lie on your stomach on the floor, a bed will be too soft. Place your palms about shoulder width apart and at the height of your head. Keeping your back as relaxed as possible, slowly straighten your elbows while keeping your hips on the floor. You may adjust the placement of your hands to maximize your comfort. As you gain motion, your hands will come more underneath your shoulders. Hold this position 30 seconds. Slowly return to lying flat on the floor. Repeat 2 times. Complete this exercise 3 times per week.   RANGE OF MOTION- Quadruped, Neutral Spine  Assume a hands and knees position on a firm surface. Keep your hands under your shoulders and your knees under your hips. You may place padding under your knees for comfort. Drop your head and point your tailbone toward the ground below you. This will round out your lower back like an angry cat. Hold this position for 30 seconds. Slowly lift your head and release your tail bone so that your back sags into a large arch, like an old horse. Hold this position for 30 seconds. Repeat this  until you feel limber in your low back. Now, find your "sweet spot." This will be the most comfortable position somewhere between the two previous positions. This is your neutral spine. Once you have found this position, tense your stomach muscles to support your low back. Hold this position for 30 seconds. Repeat 2 times. Complete this exercise 3 times per week.   STRENGTHENING EXERCISES - Low Back Sprain These exercises may help you when beginning to rehabilitate your injury. These exercises should be done near your "sweet spot." This is the neutral, low-back arch, somewhere between fully rounded and fully arched, that is your least painful position. When performed in this safe range of motion, these exercises can be used for people who have either a flexion or extension based injury. These exercises may resolve your symptoms with or without further involvement from your physician, physical therapist or athletic trainer. While completing these exercises, remember:  Muscles can gain both the endurance and the strength needed  for everyday activities through controlled exercises. Complete these exercises as instructed by your physician, physical therapist or athletic trainer. Increase the resistance and repetitions only as guided. You may experience muscle soreness or fatigue, but the pain or discomfort you are trying to eliminate should never worsen during these exercises. If this pain does worsen, stop and make certain you are following the directions exactly. If the pain is still present after adjustments, discontinue the exercise until you can discuss the trouble with your caregiver.  STRENGTHENING - Deep Abdominals, Pelvic Tilt  Lie on a firm bed or floor. Keeping your legs in front of you, bend your knees so they are both pointed toward the ceiling and your feet are flat on the floor. Tense your lower abdominal muscles to press your low back into the floor. This motion will rotate your pelvis so  that your tail bone is scooping upwards rather than pointing at your feet or into the floor. With a gentle tension and even breathing, hold this position for 3 seconds. Repeat 2 times. Complete this exercise 3 times per week.   STRENGTHENING - Abdominals, Crunches  Lie on a firm bed or floor. Keeping your legs in front of you, bend your knees so they are both pointed toward the ceiling and your feet are flat on the floor. Cross your arms over your chest. Slightly tip your chin down without bending your neck. Tense your abdominals and slowly lift your trunk high enough to just clear your shoulder blades. Lifting higher can put excessive stress on the lower back and does not further strengthen your abdominal muscles. Control your return to the starting position. Repeat 2 times. Complete this exercise 3 times per week.   STRENGTHENING - Quadruped, Opposite UE/LE Lift  Assume a hands and knees position on a firm surface. Keep your hands under your shoulders and your knees under your hips. You may place padding under your knees for comfort. Find your neutral spine and gently tense your abdominal muscles so that you can maintain this position. Your shoulders and hips should form a rectangle that is parallel with the floor and is not twisted. Keeping your trunk steady, lift your right hand no higher than your shoulder and then your left leg no higher than your hip. Make sure you are not holding your breath. Hold this position for 30 seconds. Continuing to keep your abdominal muscles tense and your back steady, slowly return to your starting position. Repeat with the opposite arm and leg. Repeat 2 times. Complete this exercise 3 times per week.   STRENGTHENING - Abdominals and Quadriceps, Straight Leg Raise  Lie on a firm bed or floor with both legs extended in front of you. Keeping one leg in contact with the floor, bend the other knee so that your foot can rest flat on the floor. Find your neutral  spine, and tense your abdominal muscles to maintain your spinal position throughout the exercise. Slowly lift your straight leg off the floor about 6 inches for a count of 3, making sure to not hold your breath. Still keeping your neutral spine, slowly lower your leg all the way to the floor. Repeat this exercise with each leg 2 times. Complete this exercise 3 times per week.  POSTURE AND BODY MECHANICS CONSIDERATIONS - Low Back Sprain Keeping correct posture when sitting, standing or completing your activities will reduce the stress put on different body tissues, allowing injured tissues a chance to heal and limiting painful experiences. The following  are general guidelines for improved posture.  While reading these guidelines, remember: The exercises prescribed by your provider will help you have the flexibility and strength to maintain correct postures. The correct posture provides the best environment for your joints to work. All of your joints have less wear and tear when properly supported by a spine with good posture. This means you will experience a healthier, less painful body. Correct posture must be practiced with all of your activities, especially prolonged sitting and standing. Correct posture is as important when doing repetitive low-stress activities (typing) as it is when doing a single heavy-load activity (lifting).  RESTING POSITIONS Consider which positions are most painful for you when choosing a resting position. If you have pain with flexion-based activities (sitting, bending, stooping, squatting), choose a position that allows you to rest in a less flexed posture. You would want to avoid curling into a fetal position on your side. If your pain worsens with extension-based activities (prolonged standing, working overhead), avoid resting in an extended position such as sleeping on your stomach. Most people will find more comfort when they rest with their spine in a more neutral  position, neither too rounded nor too arched. Lying on a non-sagging bed on your side with a pillow between your knees, or on your back with a pillow under your knees will often provide some relief. Keep in mind, being in any one position for a prolonged period of time, no matter how correct your posture, can still lead to stiffness.  PROPER SITTING POSTURE In order to minimize stress and discomfort on your spine, you must sit with correct posture. Sitting with good posture should be effortless for a healthy body. Returning to good posture is a gradual process. Many people can work toward this most comfortably by using various supports until they have the flexibility and strength to maintain this posture on their own. When sitting with proper posture, your ears will fall over your shoulders and your shoulders will fall over your hips. You should use the back of the chair to support your upper back. Your lower back will be in a neutral position, just slightly arched. You may place a small pillow or folded towel at the base of your lower back for  support.  When working at a desk, create an environment that supports good, upright posture. Without extra support, muscles tire, which leads to excessive strain on joints and other tissues. Keep these recommendations in mind:  CHAIR: A chair should be able to slide under your desk when your back makes contact with the back of the chair. This allows you to work closely. The chair's height should allow your eyes to be level with the upper part of your monitor and your hands to be slightly lower than your elbows.  BODY POSITION Your feet should make contact with the floor. If this is not possible, use a foot rest. Keep your ears over your shoulders. This will reduce stress on your neck and low back.  INCORRECT SITTING POSTURES  If you are feeling tired and unable to assume a healthy sitting posture, do not slouch or slump. This puts excessive strain on your  back tissues, causing more damage and pain. Healthier options include: Using more support, like a lumbar pillow. Switching tasks to something that requires you to be upright or walking. Talking a brief walk. Lying down to rest in a neutral-spine position.  PROLONGED STANDING WHILE SLIGHTLY LEANING FORWARD  When completing a task that requires  you to lean forward while standing in one place for a long time, place either foot up on a stationary 2-4 inch high object to help maintain the best posture. When both feet are on the ground, the lower back tends to lose its slight inward curve. If this curve flattens (or becomes too large), then the back and your other joints will experience too much stress, tire more quickly, and can cause pain.  CORRECT STANDING POSTURES Proper standing posture should be assumed with all daily activities, even if they only take a few moments, like when brushing your teeth. As in sitting, your ears should fall over your shoulders and your shoulders should fall over your hips. You should keep a slight tension in your abdominal muscles to brace your spine. Your tailbone should point down to the ground, not behind your body, resulting in an over-extended swayback posture.   INCORRECT STANDING POSTURES  Common incorrect standing postures include a forward head, locked knees and/or an excessive swayback. WALKING Walk with an upright posture. Your ears, shoulders and hips should all line-up.  PROLONGED ACTIVITY IN A FLEXED POSITION When completing a task that requires you to bend forward at your waist or lean over a low surface, try to find a way to stabilize 3 out of 4 of your limbs. You can place a hand or elbow on your thigh or rest a knee on the surface you are reaching across. This will provide you more stability, so that your muscles do not tire as quickly. By keeping your knees relaxed, or slightly bent, you will also reduce stress across your lower back. CORRECT LIFTING  TECHNIQUES  DO : Assume a wide stance. This will provide you more stability and the opportunity to get as close as possible to the object which you are lifting. Tense your abdominals to brace your spine. Bend at the knees and hips. Keeping your back locked in a neutral-spine position, lift using your leg muscles. Lift with your legs, keeping your back straight. Test the weight of unknown objects before attempting to lift them. Try to keep your elbows locked down at your sides in order get the best strength from your shoulders when carrying an object.   Always ask for help when lifting heavy or awkward objects. INCORRECT LIFTING TECHNIQUES DO NOT:  Lock your knees when lifting, even if it is a small object. Bend and twist. Pivot at your feet or move your feet when needing to change directions. Assume that you can safely pick up even a paperclip without proper posture.

## 2022-09-23 ENCOUNTER — Other Ambulatory Visit: Payer: Self-pay | Admitting: Family Medicine

## 2022-09-23 DIAGNOSIS — F339 Major depressive disorder, recurrent, unspecified: Secondary | ICD-10-CM

## 2022-09-23 DIAGNOSIS — F411 Generalized anxiety disorder: Secondary | ICD-10-CM

## 2022-10-18 ENCOUNTER — Encounter: Payer: Self-pay | Admitting: Family Medicine

## 2022-10-18 ENCOUNTER — Ambulatory Visit (INDEPENDENT_AMBULATORY_CARE_PROVIDER_SITE_OTHER): Payer: 59 | Admitting: Family Medicine

## 2022-10-18 VITALS — BP 120/80 | HR 96 | Temp 98.2°F | Ht 70.0 in | Wt 247.2 lb

## 2022-10-18 DIAGNOSIS — Z23 Encounter for immunization: Secondary | ICD-10-CM

## 2022-10-18 DIAGNOSIS — R7303 Prediabetes: Secondary | ICD-10-CM | POA: Diagnosis not present

## 2022-10-18 DIAGNOSIS — M51369 Other intervertebral disc degeneration, lumbar region without mention of lumbar back pain or lower extremity pain: Secondary | ICD-10-CM

## 2022-10-18 DIAGNOSIS — M545 Low back pain, unspecified: Secondary | ICD-10-CM | POA: Diagnosis not present

## 2022-10-18 DIAGNOSIS — I10 Essential (primary) hypertension: Secondary | ICD-10-CM | POA: Diagnosis not present

## 2022-10-18 DIAGNOSIS — R29898 Other symptoms and signs involving the musculoskeletal system: Secondary | ICD-10-CM

## 2022-10-18 DIAGNOSIS — G8929 Other chronic pain: Secondary | ICD-10-CM

## 2022-10-18 DIAGNOSIS — Z0000 Encounter for general adult medical examination without abnormal findings: Secondary | ICD-10-CM | POA: Diagnosis not present

## 2022-10-18 NOTE — Progress Notes (Signed)
Chief Complaint  Patient presents with   Annual Exam    Well Male Calvin Bautista is here for a complete physical.  Here w spouse.  His last physical was >1 year ago.  Current diet: in general, a "pretty good" diet.  "Terrible" according to his wife.  Current exercise: lifting wts Weight trend: stable Fatigue out of ordinary? No. Seat belt? Yes.   Advanced directive? No  Health maintenance Shingrix- Yes Tetanus- Due Pneumonia vaccine- Yes  Low back pain/leg weakness Over the past year, the patient is been having worsening right-sided low back pain and weakness of both lower extremities.  He was evaluated by a neurologist in [place] and was told he had a bulging disc affecting the strength in his legs.  He moved down here before further evaluation to take place.  No recent injury or change activity.  He was sent to physical therapy for balance issues which did not help.  No loss of bowel/bladder control.  Past Medical History:  Diagnosis Date   Cancer (HCC)    Hypertension    Leg weakness, bilateral    left is worse; started around 2014   Seasonal allergies      Past Surgical History:  Procedure Laterality Date   KNEE SURGERY     PROSTATE SURGERY      Medications  Current Outpatient Medications on File Prior to Visit  Medication Sig Dispense Refill   albuterol (VENTOLIN HFA) 108 (90 Base) MCG/ACT inhaler Inhale 2 puffs into the lungs every 6 (six) hours as needed. 18 g 2   azelastine (ASTELIN) 0.1 % nasal spray Place 1 spray into both nostrils 2 (two) times daily.     diltiazem (TIAZAC) 240 MG 24 hr capsule Take 240 mg by mouth daily.     fexofenadine (ALLEGRA) 180 MG tablet Take 180 mg by mouth daily.     Fluticasone-Umeclidin-Vilant (TRELEGY ELLIPTA) 200-62.5-25 MCG/ACT AEPB Inhale 1 puff daily. 28 each 11   losartan (COZAAR) 50 MG tablet Take 1 tablet (50 mg total) by mouth daily. 90 tablet 0   montelukast (SINGULAIR) 10 MG tablet Take 1 tablet (10 mg total) by mouth  at bedtime. 90 tablet 2   sertraline (ZOLOFT) 25 MG tablet TAKE 1 TABLET (25 MG TOTAL) BY MOUTH DAILY. 90 tablet 2   tolterodine (DETROL LA) 4 MG 24 hr capsule Take 1 capsule (4 mg total) by mouth daily. 90 capsule 3   Allergies Allergies  Allergen Reactions   Iodine Anaphylaxis    Family History Family History  Problem Relation Age of Onset   Cancer Neg Hx     Review of Systems: Constitutional:  no fevers Eye:  no recent significant change in vision Ears:  No changes in hearing Nose/Mouth/Throat:  no complaints of nasal congestion, no sore throat Cardiovascular: no chest pain Respiratory:  No shortness of breath Gastrointestinal:  No change in bowel habits GU:  No frequency Integumentary:  no abnormal skin lesions reported MSK: +back pain Neurologic:  no headaches Endocrine:  denies unexplained weight changes  Exam BP 120/80 (BP Location: Right Arm, Patient Position: Sitting, Cuff Size: Large)   Pulse 96   Temp 98.2 F (36.8 C) (Oral)   Ht 5\' 10"  (1.778 m)   Wt 247 lb 4 oz (112.2 kg)   SpO2 99%   BMI 35.48 kg/m  General:  well developed, well nourished, in no apparent distress Skin:  no significant moles, warts, or growths Head:  no masses, lesions, or tenderness Eyes:  pupils equal and round, sclera anicteric without injection Ears:  canals without lesions, TMs shiny without retraction, no obvious effusion, no erythema Nose:  nares patent, mucosa normal Throat/Pharynx:  lips and gingiva without lesion; tongue and uvula midline; non-inflamed pharynx; no exudates or postnasal drainage Lungs:  clear to auscultation, breath sounds equal bilaterally, no respiratory distress Cardio:  regular rate and rhythm, no LE edema or bruits Rectal: Deferred GI: BS+, S, NT, ND, no masses or organomegaly Musculoskeletal: +TTP over R lumbar parasp msc, poor hamstring ROM b/l, symmetrical muscle groups noted without atrophy or deformity Neuro:  gait normal; deep tendon reflexes  normal and symmetric Psych: well oriented with normal range of affect and appropriate judgment/insight  Assessment and Plan  Well adult exam  Essential hypertension - Plan: CBC, Comprehensive metabolic panel, Lipid panel  Prediabetes - Plan: Hemoglobin A1c  Weakness of both lower extremities - Plan: Ambulatory referral to Neurosurgery  Chronic right-sided low back pain without sciatica - Plan: Ambulatory referral to Neurosurgery  Bulging lumbar disc - Plan: Ambulatory referral to Neurosurgery   Well 82 y.o. male. Counseled on diet and exercise. Advanced directive form provided today.  Tetanus booster rec'd.  Flu shot today.  Low back pain: Chronic, uncontrolled. Stretches/exercises provided for more acute pain. Reports having neuro eval in [place] where he was told he has a bulging disc causing weakness in legs. He has failed PT. Will refer to neurosurgery for their opinion.  Other orders as above. Follow up in 6 mo.  The patient voiced understanding and agreement to the plan.  Jilda Roche [place], DO 10/18/22 1:07 PM

## 2022-10-18 NOTE — Patient Instructions (Addendum)
Give [place] 2-3 business days to get the results of your labs back.   Keep the diet clean and stay active.  Please get me a copy of your advanced directive form at your convenience.   Please consider getting your tetanus booster at the pharmacy.   If you do not hear anything about your referral in the next 1-2 weeks, call our office and ask for an update.  Let us know if you need anything.  EXERCISES  RANGE OF MOTION (ROM) AND STRETCHING EXERCISES - Low Back Pain Most people with lower back pain will find that their symptoms get worse with excessive bending forward (flexion) or arching at the lower back (extension). The exercises that will help resolve your symptoms will focus on the opposite motion.  If you have pain, numbness or tingling which travels down into your buttocks, leg or foot, the goal of the therapy is for these symptoms to move closer to your back and eventually resolve. Sometimes, these leg symptoms will get better, but your lower back pain may worsen. This is often an indication of progress in your rehabilitation. Be very alert to any changes in your symptoms and the activities in which you participated in the 24 hours prior to the change. Sharing this information with your caregiver will allow him or her to most efficiently treat your condition. These exercises may help you when beginning to rehabilitate your injury. Your symptoms may resolve with or without further involvement from your physician, physical therapist or athletic trainer. While completing these exercises, remember:  Restoring tissue flexibility helps normal motion to return to the joints. This allows healthier, less painful movement and activity. An effective stretch should be held for at least 30 seconds. A stretch should never be painful. You should only feel a gentle lengthening or release in the stretched tissue. FLEXION RANGE OF MOTION AND STRETCHING EXERCISES:  STRETCH - Flexion, Single Knee to Chest  Lie  on a firm bed or floor with both legs extended in front of you. Keeping one leg in contact with the floor, bring your opposite knee to your chest. Hold your leg in place by either grabbing behind your thigh or at your knee. Pull until you feel a gentle stretch in your low back. Hold 30 seconds. Slowly release your grasp and repeat the exercise with the opposite side. Repeat 2 times. Complete this exercise 3 times per week.   STRETCH - Flexion, Double Knee to Chest Lie on a firm bed or floor with both legs extended in front of you. Keeping one leg in contact with the floor, bring your opposite knee to your chest. Tense your stomach muscles to support your back and then lift your other knee to your chest. Hold your legs in place by either grabbing behind your thighs or at your knees. Pull both knees toward your chest until you feel a gentle stretch in your low back. Hold 30 seconds. Tense your stomach muscles and slowly return one leg at a time to the floor. Repeat 2 times. Complete this exercise 3 times per week.   STRETCH - Low Trunk Rotation Lie on a firm bed or floor. Keeping your legs in front of you, bend your knees so they are both pointed toward the ceiling and your feet are flat on the floor. Extend your arms out to the side. This will stabilize your upper body by keeping your shoulders in contact with the floor. Gently and slowly drop both knees together to one side  until you feel a gentle stretch in your low back. Hold for 30 seconds. Tense your stomach muscles to support your lower back as you bring your knees back to the starting position. Repeat the exercise to the other side. Repeat 2 times. Complete this exercise at least 3 times per week.   EXTENSION RANGE OF MOTION AND FLEXIBILITY EXERCISES:  STRETCH - Extension, Prone on Elbows  Lie on your stomach on the floor, a bed will be too soft. Place your palms about shoulder width apart and at the height of your head. Place your  elbows under your shoulders. If this is too painful, stack pillows under your chest. Allow your body to relax so that your hips drop lower and make contact more completely with the floor. Hold this position for 30 seconds. Slowly return to lying flat on the floor. Repeat 2 times. Complete this exercise 3 times per week.   RANGE OF MOTION - Extension, Prone Press Ups Lie on your stomach on the floor, a bed will be too soft. Place your palms about shoulder width apart and at the height of your head. Keeping your back as relaxed as possible, slowly straighten your elbows while keeping your hips on the floor. You may adjust the placement of your hands to maximize your comfort. As you gain motion, your hands will come more underneath your shoulders. Hold this position 30 seconds. Slowly return to lying flat on the floor. Repeat 2 times. Complete this exercise 3 times per week.   RANGE OF MOTION- Quadruped, Neutral Spine  Assume a hands and knees position on a firm surface. Keep your hands under your shoulders and your knees under your hips. You may place padding under your knees for comfort. Drop your head and point your tailbone toward the ground below you. This will round out your lower back like an angry cat. Hold this position for 30 seconds. Slowly lift your head and release your tail bone so that your back sags into a large arch, like an old horse. Hold this position for 30 seconds. Repeat this until you feel limber in your low back. Now, find your "sweet spot." This will be the most comfortable position somewhere between the two previous positions. This is your neutral spine. Once you have found this position, tense your stomach muscles to support your low back. Hold this position for 30 seconds. Repeat 2 times. Complete this exercise 3 times per week.   STRENGTHENING EXERCISES - Low Back Sprain These exercises may help you when beginning to rehabilitate your injury. These exercises should  be done near your "sweet spot." This is the neutral, low-back arch, somewhere between fully rounded and fully arched, that is your least painful position. When performed in this safe range of motion, these exercises can be used for people who have either a flexion or extension based injury. These exercises may resolve your symptoms with or without further involvement from your physician, physical therapist or athletic trainer. While completing these exercises, remember:  Muscles can gain both the endurance and the strength needed for everyday activities through controlled exercises. Complete these exercises as instructed by your physician, physical therapist or athletic trainer. Increase the resistance and repetitions only as guided. You may experience muscle soreness or fatigue, but the pain or discomfort you are trying to eliminate should never worsen during these exercises. If this pain does worsen, stop and make certain you are following the directions exactly. If the pain is still present after adjustments, discontinue  the exercise until you can discuss the trouble with your caregiver.  STRENGTHENING - Deep Abdominals, Pelvic Tilt  Lie on a firm bed or floor. Keeping your legs in front of you, bend your knees so they are both pointed toward the ceiling and your feet are flat on the floor. Tense your lower abdominal muscles to press your low back into the floor. This motion will rotate your pelvis so that your tail bone is scooping upwards rather than pointing at your feet or into the floor. With a gentle tension and even breathing, hold this position for 3 seconds. Repeat 2 times. Complete this exercise 3 times per week.   STRENGTHENING - Abdominals, Crunches  Lie on a firm bed or floor. Keeping your legs in front of you, bend your knees so they are both pointed toward the ceiling and your feet are flat on the floor. Cross your arms over your chest. Slightly tip your chin down without bending your  neck. Tense your abdominals and slowly lift your trunk high enough to just clear your shoulder blades. Lifting higher can put excessive stress on the lower back and does not further strengthen your abdominal muscles. Control your return to the starting position. Repeat 2 times. Complete this exercise 3 times per week.   STRENGTHENING - Quadruped, Opposite UE/LE Lift  Assume a hands and knees position on a firm surface. Keep your hands under your shoulders and your knees under your hips. You may place padding under your knees for comfort. Find your neutral spine and gently tense your abdominal muscles so that you can maintain this position. Your shoulders and hips should form a rectangle that is parallel with the floor and is not twisted. Keeping your trunk steady, lift your right hand no higher than your shoulder and then your left leg no higher than your hip. Make sure you are not holding your breath. Hold this position for 30 seconds. Continuing to keep your abdominal muscles tense and your back steady, slowly return to your starting position. Repeat with the opposite arm and leg. Repeat 2 times. Complete this exercise 3 times per week.   STRENGTHENING - Abdominals and Quadriceps, Straight Leg Raise  Lie on a firm bed or floor with both legs extended in front of you. Keeping one leg in contact with the floor, bend the other knee so that your foot can rest flat on the floor. Find your neutral spine, and tense your abdominal muscles to maintain your spinal position throughout the exercise. Slowly lift your straight leg off the floor about 6 inches for a count of 3, making sure to not hold your breath. Still keeping your neutral spine, slowly lower your leg all the way to the floor. Repeat this exercise with each leg 2 times. Complete this exercise 3 times per week.  POSTURE AND BODY MECHANICS CONSIDERATIONS - Low Back Sprain Keeping correct posture when sitting, standing or completing your  activities will reduce the stress put on different body tissues, allowing injured tissues a chance to heal and limiting painful experiences. The following are general guidelines for improved posture.  While reading these guidelines, remember: The exercises prescribed by your provider will help you have the flexibility and strength to maintain correct postures. The correct posture provides the best environment for your joints to work. All of your joints have less wear and tear when properly supported by a spine with good posture. This means you will experience a healthier, less painful body. Correct posture must be  practiced with all of your activities, especially prolonged sitting and standing. Correct posture is as important when doing repetitive low-stress activities (typing) as it is when doing a single heavy-load activity (lifting).  RESTING POSITIONS Consider which positions are most painful for you when choosing a resting position. If you have pain with flexion-based activities (sitting, bending, stooping, squatting), choose a position that allows you to rest in a less flexed posture. You would want to avoid curling into a fetal position on your side. If your pain worsens with extension-based activities (prolonged standing, working overhead), avoid resting in an extended position such as sleeping on your stomach. Most people will find more comfort when they rest with their spine in a more neutral position, neither too rounded nor too arched. Lying on a non-sagging bed on your side with a pillow between your knees, or on your back with a pillow under your knees will often provide some relief. Keep in mind, being in any one position for a prolonged period of time, no matter how correct your posture, can still lead to stiffness.  PROPER SITTING POSTURE In order to minimize stress and discomfort on your spine, you must sit with correct posture. Sitting with good posture should be effortless for a healthy  body. Returning to good posture is a gradual process. Many people can work toward this most comfortably by using various supports until they have the flexibility and strength to maintain this posture on their own. When sitting with proper posture, your ears will fall over your shoulders and your shoulders will fall over your hips. You should use the back of the chair to support your upper back. Your lower back will be in a neutral position, just slightly arched. You may place a small pillow or folded towel at the base of your lower back for  support.  When working at a desk, create an environment that supports good, upright posture. Without extra support, muscles tire, which leads to excessive strain on joints and other tissues. Keep these recommendations in mind:  CHAIR: A chair should be able to slide under your desk when your back makes contact with the back of the chair. This allows you to work closely. The chair's height should allow your eyes to be level with the upper part of your monitor and your hands to be slightly lower than your elbows.  BODY POSITION Your feet should make contact with the floor. If this is not possible, use a foot rest. Keep your ears over your shoulders. This will reduce stress on your neck and low back.  INCORRECT SITTING POSTURES  If you are feeling tired and unable to assume a healthy sitting posture, do not slouch or slump. This puts excessive strain on your back tissues, causing more damage and pain. Healthier options include: Using more support, like a lumbar pillow. Switching tasks to something that requires you to be upright or walking. Talking a brief walk. Lying down to rest in a neutral-spine position.  PROLONGED STANDING WHILE SLIGHTLY LEANING FORWARD  When completing a task that requires you to lean forward while standing in one place for a long time, place either foot up on a stationary 2-4 inch high object to help maintain the best posture. When both  feet are on the ground, the lower back tends to lose its slight inward curve. If this curve flattens (or becomes too large), then the back and your other joints will experience too much stress, tire more quickly, and can cause pain.  CORRECT STANDING POSTURES Proper standing posture should be assumed with all daily activities, even if they only take a few moments, like when brushing your teeth. As in sitting, your ears should fall over your shoulders and your shoulders should fall over your hips. You should keep a slight tension in your abdominal muscles to brace your spine. Your tailbone should point down to the ground, not behind your body, resulting in an over-extended swayback posture.   INCORRECT STANDING POSTURES  Common incorrect standing postures include a forward head, locked knees and/or an excessive swayback. WALKING Walk with an upright posture. Your ears, shoulders and hips should all line-up.  PROLONGED ACTIVITY IN A FLEXED POSITION When completing a task that requires you to bend forward at your waist or lean over a low surface, try to find a way to stabilize 3 out of 4 of your limbs. You can place a hand or elbow on your thigh or rest a knee on the surface you are reaching across. This will provide you more stability, so that your muscles do not tire as quickly. By keeping your knees relaxed, or slightly bent, you will also reduce stress across your lower back. CORRECT LIFTING TECHNIQUES  DO : Assume a wide stance. This will provide you more stability and the opportunity to get as close as possible to the object which you are lifting. Tense your abdominals to brace your spine. Bend at the knees and hips. Keeping your back locked in a neutral-spine position, lift using your leg muscles. Lift with your legs, keeping your back straight. Test the weight of unknown objects before attempting to lift them. Try to keep your elbows locked down at your sides in order get the best strength  from your shoulders when carrying an object.   Always ask for help when lifting heavy or awkward objects. INCORRECT LIFTING TECHNIQUES DO NOT:  Lock your knees when lifting, even if it is a small object. Bend and twist. Pivot at your feet or move your feet when needing to change directions. Assume that you can safely pick up even a paperclip without proper posture.

## 2022-10-19 LAB — LIPID PANEL
Cholesterol: 193 mg/dL (ref 0–200)
HDL: 48.3 mg/dL (ref >39.00)
LDL Cholesterol: 128 mg/dL — ABNORMAL HIGH (ref 0–99)
NonHDL: 144.8
Total CHOL/HDL Ratio: 4
Triglycerides: 82 mg/dL (ref 0.0–149.0)
VLDL: 16.4 mg/dL (ref 0.0–40.0)

## 2022-10-19 LAB — COMPREHENSIVE METABOLIC PANEL
ALT: 12 U/L (ref 0–53)
AST: 14 U/L (ref 0–37)
Albumin: 4.1 g/dL (ref 3.5–5.2)
Alkaline Phosphatase: 75 U/L (ref 39–117)
BUN: 18 mg/dL (ref 6–23)
CO2: 27 meq/L (ref 19–32)
Calcium: 9.8 mg/dL (ref 8.4–10.5)
Chloride: 103 meq/L (ref 96–112)
Creatinine, Ser: 1.23 mg/dL (ref 0.40–1.50)
GFR: 54.69 mL/min — ABNORMAL LOW (ref >60.00)
Glucose, Bld: 80 mg/dL (ref 70–99)
Potassium: 4.6 meq/L (ref 3.5–5.1)
Sodium: 140 meq/L (ref 135–145)
Total Bilirubin: 0.8 mg/dL (ref 0.2–1.2)
Total Protein: 6.5 g/dL (ref 6.0–8.3)

## 2022-10-19 LAB — CBC
HCT: 42.7 % (ref 39.0–52.0)
Hemoglobin: 13.5 g/dL (ref 13.0–17.0)
MCHC: 31.5 g/dL (ref 30.0–36.0)
MCV: 88.7 fL (ref 78.0–100.0)
Platelets: 336 10*3/uL (ref 150.0–400.0)
RBC: 4.82 Mil/uL (ref 4.22–5.81)
RDW: 14.3 % (ref 11.5–15.5)
WBC: 9 10*3/uL (ref 4.0–10.5)

## 2022-10-19 LAB — HEMOGLOBIN A1C: Hgb A1c MFr Bld: 6 % (ref 4.6–6.5)

## 2022-11-18 ENCOUNTER — Other Ambulatory Visit: Payer: Self-pay | Admitting: Family Medicine

## 2022-11-18 DIAGNOSIS — I10 Essential (primary) hypertension: Secondary | ICD-10-CM

## 2022-11-22 ENCOUNTER — Ambulatory Visit (INDEPENDENT_AMBULATORY_CARE_PROVIDER_SITE_OTHER): Payer: 59 | Admitting: *Deleted

## 2022-11-22 VITALS — BP 112/67 | HR 85 | Ht 70.0 in | Wt 248.8 lb

## 2022-11-22 DIAGNOSIS — Z0000 Encounter for general adult medical examination without abnormal findings: Secondary | ICD-10-CM

## 2022-11-22 NOTE — Progress Notes (Signed)
Subjective:   Calvin Bautista is a 82 y.o. male who presents for Medicare Annual/Subsequent preventive examination.  Visit Complete: In person   Cardiac Risk Factors include: advanced age (>13men, >58 women);obesity (BMI >30kg/m2);male gender;dyslipidemia;hypertension     Objective:    Today's Vitals   11/22/22 1105 11/22/22 1116  BP: 98/60 112/67  Pulse: (!) 103 85  Weight: 248 lb 12.8 oz (112.9 kg)   Height: 5\' 10"  (1.778 m)    Body mass index is 35.7 kg/m.     11/22/2022   11:45 AM 06/10/2022    9:10 AM 06/02/2022    9:34 AM  Advanced Directives  Does Patient Have a Medical Advance Directive? No No No  Would patient like information on creating a medical advance directive? No - Patient declined No - Patient declined No - Patient declined    Current Medications (verified) Outpatient Encounter Medications as of 11/22/2022  Medication Sig   albuterol (VENTOLIN HFA) 108 (90 Base) MCG/ACT inhaler Inhale 2 puffs into the lungs every 6 (six) hours as needed.   azelastine (ASTELIN) 0.1 % nasal spray Place 1 spray into both nostrils 2 (two) times daily.   diltiazem (TIAZAC) 240 MG 24 hr capsule Take 240 mg by mouth daily.   fexofenadine (ALLEGRA) 180 MG tablet Take 180 mg by mouth daily.   Fluticasone-Umeclidin-Vilant (TRELEGY ELLIPTA) 200-62.5-25 MCG/ACT AEPB Inhale 1 puff daily.   losartan (COZAAR) 50 MG tablet TAKE 1 TABLET BY MOUTH EVERY DAY   montelukast (SINGULAIR) 10 MG tablet Take 1 tablet (10 mg total) by mouth at bedtime.   sertraline (ZOLOFT) 25 MG tablet TAKE 1 TABLET (25 MG TOTAL) BY MOUTH DAILY.   tolterodine (DETROL LA) 4 MG 24 hr capsule Take 1 capsule (4 mg total) by mouth daily.   No facility-administered encounter medications on file as of 11/22/2022.    Allergies (verified) Iodine   History: Past Medical History:  Diagnosis Date   Cancer (HCC)    Hypertension    Leg weakness, bilateral    left is worse; started around 2014   Seasonal allergies     Past Surgical History:  Procedure Laterality Date   KNEE SURGERY     PROSTATE SURGERY     Family History  Problem Relation Age of Onset   Cancer Neg Hx    Social History   Socioeconomic History   Marital status: Married    Spouse name: Not on file   Number of children: Not on file   Years of education: Not on file   Highest education level: Not on file  Occupational History   Not on file  Tobacco Use   Smoking status: Never   Smokeless tobacco: Not on file  Substance and Sexual Activity   Alcohol use: Yes   Drug use: No   Sexual activity: Not on file  Other Topics Concern   Not on file  Social History Narrative   Not on file   Social Determinants of Health   Financial Resource Strain: Low Risk  (11/22/2022)   Overall Financial Resource Strain (CARDIA)    Difficulty of Paying Living Expenses: Not hard at all  Food Insecurity: No Food Insecurity (11/22/2022)   Hunger Vital Sign    Worried About Running Out of Food in the Last Year: Never true    Ran Out of Food in the Last Year: Never true  Transportation Needs: No Transportation Needs (11/22/2022)   PRAPARE - Administrator, Civil Service (Medical): No  Lack of Transportation (Non-Medical): No  Physical Activity: Inactive (11/22/2022)   Exercise Vital Sign    Days of Exercise per Week: 0 days    Minutes of Exercise per Session: 0 min  Stress: No Stress Concern Present (11/22/2022)   [employer] of Occupational Health - Occupational Stress Questionnaire    Feeling of Stress : Only a little  Social Connections: Moderately Integrated (11/22/2022)   Social Connection and Isolation Panel [NHANES]    Frequency of Communication with Friends and Family: More than three times a week    Frequency of Social Gatherings with Friends and Family: More than three times a week    Attends Religious Services: More than 4 times per year    Active Member of [employer] or Organizations: No    Attends Museum/gallery exhibitions officer: Never    Marital Status: Married    Tobacco Counseling Counseling given: Not Answered   Clinical Intake:  Pre-visit preparation completed: Yes  Pain : No/denies pain  BMI - recorded: 35.7 Nutritional Risks: None Diabetes: No  How often do you need to have someone help you when you read instructions, pamphlets, or other written materials from your doctor or pharmacy?: 1 - Never  Interpreter Needed?: No  Information entered by :: [employer], CMA   Activities of Daily Living    11/22/2022   11:09 AM  In your present state of health, do you have any difficulty performing the following activities:  Hearing? 0  Vision? 0  Difficulty concentrating or making decisions? 0  Walking or climbing stairs? 0  Dressing or bathing? 0  Doing errands, shopping? 0  Preparing Food and eating ? N  Using the Toilet? N  In the past six months, have you accidently leaked urine? Y  Do you have problems with loss of bowel control? N  Managing your Medications? N  Managing your Finances? N  Housekeeping or managing your Housekeeping? N    Patient Care Team: Sharlene Dory, DO as PCP - General (Family Medicine)  Indicate any recent Medical Services you may have received from other than [facility] providers in the past year (date may be approximate).     Assessment:   This is a routine wellness examination for Loy.  Hearing/Vision screen No results found.   Goals Addressed   None    Depression Screen    11/22/2022   11:22 AM 10/18/2022   12:41 PM 02/21/2022    1:04 PM  PHQ 2/9 Scores  PHQ - 2 Score 2 0 0  PHQ- 9 Score  1     Fall Risk    11/22/2022   11:12 AM 10/18/2022   12:40 PM 02/21/2022   12:54 PM  Fall Risk   Falls in the past year? 1 1 1   Comment  tripped over something   Number falls in past yr: 0 1 1  Injury with Fall? 0 0 0  Risk for fall due to : Impaired balance/gait No Fall Risks Impaired mobility;Impaired balance/gait   Follow up Falls evaluation completed Falls evaluation completed     MEDICARE RISK AT HOME: Medicare Risk at Home Any stairs in or around the home?: No If so, are there any without handrails?: No Home free of loose throw rugs in walkways, pet beds, electrical cords, etc?: Yes Adequate lighting in your home to reduce risk of falls?: Yes Life alert?: No Use of a cane, walker or w/c?: Yes Grab bars in the bathroom?: Yes Shower chair or bench  in shower?: Yes Elevated toilet seat or a handicapped toilet?: No  TIMED UP AND GO:  Was the test performed?  Yes  Length of time to ambulate 10 feet: 9 sec Gait slow and steady with assistive device    Cognitive Function:        11/22/2022   11:24 AM  6CIT Screen  What Year? 0 points  What month? 0 points  What time? 0 points  Count back from 20 0 points  Months in reverse 0 points  Repeat phrase 0 points  Total Score 0 points    Immunizations Immunization History  Administered Date(s) Administered   Fluad Quad(high Dose 65+) 09/27/2018, 09/28/2018, 12/24/2021   Fluad Trivalent(High Dose 65+) 10/18/2022   Pneumococcal Conjugate-13 08/25/2006   Pneumococcal Polysaccharide-23 02/28/2020   Pneumococcal-Unspecified 08/25/2006   Zoster Recombinant(Shingrix) 01/22/2020, 03/26/2020    TDAP status: Due, Education has been provided regarding the importance of this vaccine. Advised may receive this vaccine at local pharmacy or Health Dept. Aware to provide a copy of the vaccination record if obtained from local pharmacy or Health Dept. Verbalized acceptance and understanding.  Flu Vaccine status: Up to date  Pneumococcal vaccine status: Up to date  Covid-19 vaccine status: Information provided on how to obtain vaccines.   Qualifies for Shingles Vaccine? Yes   Zostavax completed No   Shingrix Completed?: Yes  Screening Tests Health Maintenance  Topic Date Due   DTaP/Tdap/Td (1 - Tdap) Never done   Medicare Annual Wellness (AWV)   12/17/2022   COVID-19 Vaccine (1 - 2023-24 season) 04/18/2023 (Originally 09/11/2022)   Pneumonia Vaccine 74+ Years old  Completed   INFLUENZA VACCINE  Completed   Zoster Vaccines- Shingrix  Completed   HPV VACCINES  Aged Out    Health Maintenance  Health Maintenance Due  Topic Date Due   DTaP/Tdap/Td (1 - Tdap) Never done   Medicare Annual Wellness (AWV)  12/17/2022    Colorectal cancer screening: No longer required.   Lung Cancer Screening: (Low Dose CT Chest recommended if Age 56-80 years, 20 pack-year currently smoking OR have quit w/in 15years.) does not qualify.   Additional Screening:  Hepatitis C Screening: does not qualify  Vision Screening: Recommended annual ophthalmology exams for early detection of glaucoma and other disorders of the eye. Is the patient up to date with their annual eye exam?  Yes  Who is the provider or what is the name of the office in which the patient attends annual eye exams? Can't remember name at this time If pt is not established with a provider, would they like to be referred to a provider to establish care? No .   Dental Screening: Recommended annual dental exams for proper oral hygiene  Diabetic Foot Exam: N/a  Community Resource Referral / Chronic Care Management: CRR required this visit?  No   CCM required this visit?  No     Plan:     I have personally reviewed and noted the following in the patient's chart:   Medical and social history Use of alcohol, tobacco or illicit drugs  Current medications and supplements including opioid prescriptions. Patient is not currently taking opioid prescriptions. Functional ability and status Nutritional status Physical activity Advanced directives List of other physicians Hospitalizations, surgeries, and ER visits in previous 12 months Vitals Screenings to include cognitive, depression, and falls Referrals and appointments  In addition, I have reviewed and discussed with patient  certain preventive protocols, quality metrics, and best practice recommendations. A written personalized  care plan for preventive services as well as general preventive health recommendations were provided to patient.     Donne Anon, CMA   11/22/2022   After Visit Summary: (In Person-Declined) Patient declined AVS at this time.  Nurse Notes: None

## 2022-11-22 NOTE — Patient Instructions (Signed)
[name] , Thank you for taking time to come for your Medicare Wellness Visit. I appreciate your ongoing commitment to your health goals. Please review the following plan we discussed and let me know if I can assist you in the future.   These are the goals we discussed:  Goals   None     This is a list of the screening recommended for you and due dates:  Health Maintenance  Topic Date Due   DTaP/Tdap/Td vaccine (1 - Tdap) Never done   COVID-19 Vaccine (1 - 2023-24 season) 04/18/2023*   Medicare Annual Wellness Visit  11/22/2023   Pneumonia Vaccine  Completed   Flu Shot  Completed   Zoster (Shingles) Vaccine  Completed   HPV Vaccine  Aged Out  *Topic was postponed. The date shown is not the original due date.     Next appointment: Follow up in one year for your annual wellness visit.   Preventive Care 30 Years and Older, Male Preventive care refers to lifestyle choices and visits with your health care provider that can promote health and wellness. What does preventive care include? A yearly physical exam. This is also called an annual well check. Dental exams once or twice a year. Routine eye exams. Ask your health care provider how often you should have your eyes checked. Personal lifestyle choices, including: Daily care of your teeth and gums. Regular physical activity. Eating a healthy diet. Avoiding tobacco and drug use. Limiting alcohol use. Practicing safe sex. Taking low doses of aspirin every day. Taking vitamin and mineral supplements as recommended by your health care provider. What happens during an annual well check? The services and screenings done by your health care provider during your annual well check will depend on your age, overall health, lifestyle risk factors, and family history of disease. Counseling  Your health care provider may ask you questions about your: Alcohol use. Tobacco use. Drug use. Emotional well-being. Home and relationship  well-being. Sexual activity. Eating habits. History of falls. Memory and ability to understand (cognition). Work and work Astronomer. Screening  You may have the following tests or measurements: Height, weight, and BMI. Blood pressure. Lipid and cholesterol levels. These may be checked every 5 years, or more frequently if you are over 63 years old. Skin check. Lung cancer screening. You may have this screening every year starting at age 69 if you have a 30-pack-year history of smoking and currently smoke or have quit within the past 15 years. Fecal occult blood test (FOBT) of the stool. You may have this test every year starting at age 35. Flexible sigmoidoscopy or colonoscopy. You may have a sigmoidoscopy every 5 years or a colonoscopy every 10 years starting at age 55. Prostate cancer screening. Recommendations will vary depending on your family history and other risks. Hepatitis C blood test. Hepatitis B blood test. Sexually transmitted disease (STD) testing. Diabetes screening. This is done by checking your blood sugar (glucose) after you have not eaten for a while (fasting). You may have this done every 1-3 years. Abdominal aortic aneurysm (AAA) screening. You may need this if you are a current or former smoker. Osteoporosis. You may be screened starting at age 29 if you are at high risk. Talk with your health care provider about your test results, treatment options, and if necessary, the need for more tests. Vaccines  Your health care provider may recommend certain vaccines, such as: Influenza vaccine. This is recommended every year. Tetanus, diphtheria, and acellular pertussis (  Tdap, Td) vaccine. You may need a Td booster every 10 years. Zoster vaccine. You may need this after age 59. Pneumococcal 13-valent conjugate (PCV13) vaccine. One dose is recommended after age 7. Pneumococcal polysaccharide (PPSV23) vaccine. One dose is recommended after age 44. Talk to your health care  provider about which screenings and vaccines you need and how often you need them. This information is not intended to replace advice given to you by your health care provider. Make sure you discuss any questions you have with your health care provider. Document Released: 01/23/2015 Document Revised: 09/16/2015 Document Reviewed: 10/28/2014 Elsevier Interactive Patient Education  2017 [employer].  Fall Prevention in the Home Falls can cause injuries. They can happen to people of all ages. There are many things you can do to make your home safe and to help prevent falls. What can I do on the outside of my home? Regularly fix the edges of walkways and driveways and fix any cracks. Remove anything that might make you trip as you walk through a door, such as a raised step or threshold. Trim any bushes or trees on the path to your home. Use bright outdoor lighting. Clear any walking paths of anything that might make someone trip, such as rocks or tools. Regularly check to see if handrails are loose or broken. Make sure that both sides of any steps have handrails. Any raised decks and porches should have guardrails on the edges. Have any leaves, snow, or ice cleared regularly. Use sand or salt on walking paths during winter. Clean up any spills in your garage right away. This includes oil or grease spills. What can I do in the bathroom? Use night lights. Install grab bars by the toilet and in the tub and shower. Do not use towel bars as grab bars. Use non-skid mats or decals in the tub or shower. If you need to sit down in the shower, use a plastic, non-slip stool. Keep the floor dry. Clean up any water that spills on the floor as soon as it happens. Remove soap buildup in the tub or shower regularly. Attach bath mats securely with double-sided non-slip rug tape. Do not have throw rugs and other things on the floor that can make you trip. What can I do in the bedroom? Use night lights. Make  sure that you have a light by your bed that is easy to reach. Do not use any sheets or blankets that are too big for your bed. They should not hang down onto the floor. Have a firm chair that has side arms. You can use this for support while you get dressed. Do not have throw rugs and other things on the floor that can make you trip. What can I do in the kitchen? Clean up any spills right away. Avoid walking on wet floors. Keep items that you use a lot in easy-to-reach places. If you need to reach something above you, use a strong step stool that has a grab bar. Keep electrical cords out of the way. Do not use floor polish or wax that makes floors slippery. If you must use wax, use non-skid floor wax. Do not have throw rugs and other things on the floor that can make you trip. What can I do with my stairs? Do not leave any items on the stairs. Make sure that there are handrails on both sides of the stairs and use them. Fix handrails that are broken or loose. Make sure that handrails are  as long as the stairways. Check any carpeting to make sure that it is firmly attached to the stairs. Fix any carpet that is loose or worn. Avoid having throw rugs at the top or bottom of the stairs. If you do have throw rugs, attach them to the floor with carpet tape. Make sure that you have a light switch at the top of the stairs and the bottom of the stairs. If you do not have them, ask someone to add them for you. What else can I do to help prevent falls? Wear shoes that: Do not have high heels. Have rubber bottoms. Are comfortable and fit you well. Are closed at the toe. Do not wear sandals. If you use a stepladder: Make sure that it is fully opened. Do not climb a closed stepladder. Make sure that both sides of the stepladder are locked into place. Ask someone to hold it for you, if possible. Clearly mark and make sure that you can see: Any grab bars or handrails. First and last steps. Where the  edge of each step is. Use tools that help you move around (mobility aids) if they are needed. These include: Canes. Walkers. Scooters. Crutches. Turn on the lights when you go into a dark area. Replace any light bulbs as soon as they burn out. Set up your furniture so you have a clear path. Avoid moving your furniture around. If any of your floors are uneven, fix them. If there are any pets around you, be aware of where they are. Review your medicines with your doctor. Some medicines can make you feel dizzy. This can increase your chance of falling. Ask your doctor what other things that you can do to help prevent falls. This information is not intended to replace advice given to you by your health care provider. Make sure you discuss any questions you have with your health care provider. Document Released: 10/23/2008 Document Revised: 06/04/2015 Document Reviewed: 01/31/2014 Elsevier Interactive Patient Education  2017 [employer].

## 2022-12-16 ENCOUNTER — Other Ambulatory Visit: Payer: Self-pay | Admitting: Family Medicine

## 2022-12-16 DIAGNOSIS — J4540 Moderate persistent asthma, uncomplicated: Secondary | ICD-10-CM

## 2022-12-27 ENCOUNTER — Other Ambulatory Visit: Payer: Self-pay | Admitting: Family Medicine

## 2022-12-27 DIAGNOSIS — I10 Essential (primary) hypertension: Secondary | ICD-10-CM

## 2023-01-05 ENCOUNTER — Emergency Department (HOSPITAL_COMMUNITY): Payer: Medicare HMO

## 2023-01-05 ENCOUNTER — Emergency Department (HOSPITAL_COMMUNITY)
Admission: EM | Admit: 2023-01-05 | Discharge: 2023-01-06 | Disposition: A | Discharge status: Home / Self Care | Payer: Medicare HMO | Attending: Emergency Medicine | Admitting: Emergency Medicine

## 2023-01-05 ENCOUNTER — Encounter (HOSPITAL_COMMUNITY): Payer: Self-pay

## 2023-01-05 DIAGNOSIS — Z7951 Long term (current) use of inhaled steroids: Secondary | ICD-10-CM | POA: Insufficient documentation

## 2023-01-05 DIAGNOSIS — M25551 Pain in right hip: Secondary | ICD-10-CM | POA: Insufficient documentation

## 2023-01-05 DIAGNOSIS — Z79899 Other long term (current) drug therapy: Secondary | ICD-10-CM | POA: Insufficient documentation

## 2023-01-05 DIAGNOSIS — M25512 Pain in left shoulder: Secondary | ICD-10-CM | POA: Insufficient documentation

## 2023-01-05 DIAGNOSIS — W19XXXA Unspecified fall, initial encounter: Secondary | ICD-10-CM | POA: Diagnosis not present

## 2023-01-05 DIAGNOSIS — N189 Chronic kidney disease, unspecified: Secondary | ICD-10-CM | POA: Insufficient documentation

## 2023-01-05 DIAGNOSIS — I129 Hypertensive chronic kidney disease with stage 1 through stage 4 chronic kidney disease, or unspecified chronic kidney disease: Secondary | ICD-10-CM | POA: Diagnosis not present

## 2023-01-05 DIAGNOSIS — M545 Low back pain, unspecified: Secondary | ICD-10-CM | POA: Insufficient documentation

## 2023-01-05 DIAGNOSIS — R262 Difficulty in walking, not elsewhere classified: Secondary | ICD-10-CM | POA: Insufficient documentation

## 2023-01-05 DIAGNOSIS — S0990XA Unspecified injury of head, initial encounter: Secondary | ICD-10-CM | POA: Diagnosis present

## 2023-01-05 DIAGNOSIS — J45909 Unspecified asthma, uncomplicated: Secondary | ICD-10-CM | POA: Insufficient documentation

## 2023-01-05 LAB — URINALYSIS, ROUTINE W REFLEX MICROSCOPIC
Bacteria, UA: NONE SEEN
Bilirubin Urine: NEGATIVE
Glucose, UA: NEGATIVE mg/dL
Hgb urine dipstick: NEGATIVE
Ketones, ur: NEGATIVE mg/dL
Leukocytes,Ua: NEGATIVE
Nitrite: NEGATIVE
Protein, ur: 30 mg/dL — AB
Specific Gravity, Urine: 1.028 (ref 1.005–1.030)
pH: 5 (ref 5.0–8.0)

## 2023-01-05 LAB — CBC
HCT: 46.7 % (ref 39.0–52.0)
Hemoglobin: 15 g/dL (ref 13.0–17.0)
MCH: 28.4 pg (ref 26.0–34.0)
MCHC: 32.1 g/dL (ref 30.0–36.0)
MCV: 88.4 fL (ref 80.0–100.0)
Platelets: 356 10*3/uL (ref 150–400)
RBC: 5.28 MIL/uL (ref 4.22–5.81)
RDW: 13.9 % (ref 11.5–15.5)
WBC: 10.5 10*3/uL (ref 4.0–10.5)
nRBC: 0 % (ref 0.0–0.2)

## 2023-01-05 LAB — BASIC METABOLIC PANEL
Anion gap: 9 (ref 5–15)
BUN: 17 mg/dL (ref 8–23)
CO2: 24 mmol/L (ref 22–32)
Calcium: 9.6 mg/dL (ref 8.9–10.3)
Chloride: 103 mmol/L (ref 98–111)
Creatinine, Ser: 1.45 mg/dL — ABNORMAL HIGH (ref 0.61–1.24)
GFR, Estimated: 48 mL/min — ABNORMAL LOW (ref >60)
Glucose, Bld: 111 mg/dL — ABNORMAL HIGH (ref 70–99)
Potassium: 4.1 mmol/L (ref 3.5–5.1)
Sodium: 136 mmol/L (ref 135–145)

## 2023-01-05 LAB — CBG MONITORING, ED: Glucose-Capillary: 110 mg/dL — ABNORMAL HIGH (ref 70–99)

## 2023-01-05 NOTE — ED Provider Notes (Signed)
[place] EMERGENCY DEPARTMENT AT [facility] Provider Note   CSN: [id number] Arrival date & time: 01/05/23  2030     History  Chief Complaint  Patient presents with   Difficulty Walking    Calvin Bautista is a 82 y.o. male.  HPI   Patient has a history of asthma hypertension lower extremity weakness, chronic kidney disease, atrial fibrillation.  Patient states at baseline he has difficulty with walking.  Has had trouble with falls in the past.  He said some chronic low back pain.  The symptoms have been ongoing for months.  Patient unfortunately however has had a couple of falls today.  He had difficulty getting up on his own but family was eventually able to get him up.  Patient continues to have some soreness now in his left shoulder.  He is also having some pain in his right hip.  Is able to bear weight.  He denies any new weakness.  No headache.  Home Medications Prior to Admission medications   Medication Sig Start Date End Date Taking? Authorizing Provider  albuterol (VENTOLIN HFA) 108 (90 Base) MCG/ACT inhaler TAKE 2 PUFFS BY MOUTH EVERY 6 HOURS AS NEEDED 12/16/22   Wendling, Jilda Roche, DO  azelastine (ASTELIN) 0.1 % nasal spray Place 1 spray into both nostrils 2 (two) times daily. 04/27/20   [provider]  diltiazem (TIAZAC) 240 MG 24 hr capsule Take 240 mg by mouth daily. 02/08/21 02/21/23  [provider]  fexofenadine (ALLEGRA) 180 MG tablet Take 180 mg by mouth daily.    [provider]  Fluticasone-Umeclidin-Vilant (TRELEGY ELLIPTA) 200-62.5-25 MCG/ACT AEPB Inhale 1 puff daily. 08/31/22   Sharlene Dory, DO  losartan (COZAAR) 50 MG tablet TAKE 1 TABLET BY MOUTH EVERY DAY 11/18/22   Carmelia Roller, Jilda Roche, DO  montelukast (SINGULAIR) 10 MG tablet Take 1 tablet (10 mg total) by mouth at bedtime. 08/31/22   Sharlene Dory, DO  sertraline (ZOLOFT) 25 MG tablet TAKE 1 TABLET (25 MG TOTAL) BY MOUTH DAILY. 09/23/22    Sharlene Dory, DO  tolterodine (DETROL LA) 4 MG 24 hr capsule Take 1 capsule (4 mg total) by mouth daily. 07/27/22   Sharlene Dory, DO      Allergies    Iodine    Review of Systems   Review of Systems  Physical Exam Updated Vital Signs BP 128/81 (BP Location: Left Arm)   Pulse 82   Temp 98.6 F (37 C) (Oral)   Resp 16   SpO2 97%  Physical Exam Vitals and nursing note reviewed.  Constitutional:      General: He is not in acute distress.    Appearance: He is well-developed.  HENT:     Head: Normocephalic and atraumatic.     Right Ear: External ear normal.     Left Ear: External ear normal.  Eyes:     General: No scleral icterus.       Right eye: No discharge.        Left eye: No discharge.     Conjunctiva/sclera: Conjunctivae normal.  Neck:     Trachea: No tracheal deviation.  Cardiovascular:     Rate and Rhythm: Normal rate and regular rhythm.  Pulmonary:     Effort: Pulmonary effort is normal. No respiratory distress.     Breath sounds: Normal breath sounds. No stridor. No wheezing or rales.  Abdominal:     General: Bowel sounds are normal. There is no distension.  Palpations: Abdomen is soft.     Tenderness: There is no abdominal tenderness. There is no guarding or rebound.  Musculoskeletal:        General: No deformity.     Left shoulder: Tenderness present.     Cervical back: Normal and neck supple.     Thoracic back: Normal.     Lumbar back: Normal.     Right hip: Bony tenderness present.  Skin:    General: Skin is warm and dry.     Findings: No rash.  Neurological:     General: No focal deficit present.     Mental Status: He is alert.     Cranial Nerves: No cranial nerve deficit, dysarthria or facial asymmetry.     Sensory: No sensory deficit.     Motor: No abnormal muscle tone or seizure activity.     Coordination: Coordination normal.     Comments: Patient able lift both legs off the bed, able lift both hands off the bed, normal  grip strength and plantarflexion strength bilaterally  Psychiatric:        Mood and Affect: Mood normal.     ED Results / Procedures / Treatments   Labs (all labs ordered are listed, but only abnormal results are displayed) Labs Reviewed  BASIC METABOLIC PANEL - Abnormal; Notable for the following components:      Result Value   Glucose, Bld 111 (*)    Creatinine, Ser 1.45 (*)    GFR, Estimated 48 (*)    All other components within normal limits  CBG MONITORING, ED - Abnormal; Notable for the following components:   Glucose-Capillary 110 (*)    All other components within normal limits  CBC  URINALYSIS, ROUTINE W REFLEX MICROSCOPIC    EKG None  Radiology DG Shoulder Left Result Date: 01/05/2023 CLINICAL DATA:  Status post fall. EXAM: LEFT SHOULDER - 2+ VIEW COMPARISON:  None Available. FINDINGS: There is no evidence of an acute fracture or dislocation. Chronic appearing deformities are seen involving the greater tubercle and inferior medial aspect of the left humeral head. Moderate to marked severity degenerative changes also seen involving the left acromioclavicular joint. Soft tissues are unremarkable. IMPRESSION: 1. Chronic appearing deformities involving the greater tubercle and inferior medial aspect of the left humeral head. 2. 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 Electronically Signed   By: Aram Candela M.D.   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  Metabolic panel shows creatinine elevated at 1.45, similar to previous values [JK]    Clinical Course User Index [JK] Linwood Dibbles, MD                                 Medical  Decision Making Problems Addressed: Fall, initial encounter: acute illness or injury that poses a threat to life or bodily functions  Amount and/or Complexity of Data Reviewed Labs: ordered. Decision-making details documented in ED Course. Radiology: ordered and independent interpretation performed.   Pt presented to the ED after fall.  Hx of gait instability, not new for him.  Considered stroke but no focal deficits noted  on exam.  Xrays did not show hip or shoulder fx dislocation.    CT scans pending at shift change.  Pt is feeling better and would like to go home.  Dr Pilar Plate to follow up ct scans.  Anticipate dc if negative.        Final Clinical Impression(s) / ED Diagnoses Final diagnoses:  None    Rx / DC Orders ED Discharge Orders     None         Linwood Dibbles, MD 01/06/23 1120

## 2023-01-05 NOTE — ED Provider Notes (Signed)
  Provider Note MRN:  [id number]  Arrival date & time: 01/06/23    ED Course and Medical Decision Making  Assumed care from Dr. Lynelle Doctor at shift change.  Mechanical fall awaiting imaging.  Has had a few falls over the past few weeks but no neurological deficits, no concern for stroke or severe metabolic derangement.  Awaiting CT imaging.  12 AM update: Patient feels well and is ready to go home.  Imaging reassuring, appropriate for discharge.  Procedures  Final Clinical Impressions(s) / ED Diagnoses     ICD-10-CM   1. Fall, initial encounter  W19.[facility]       ED Discharge Orders     None         Discharge Instructions      You were evaluated in the Emergency Department and after careful evaluation, we did not find any emergent condition requiring admission or further testing in the hospital.  Your exam/testing today is overall reassuring.  Your x-rays and CT scans did not show any significant injury.  Recommend follow-up with your primary care doctor.  Please return to the Emergency Department if you experience any worsening of your condition.   Thank you for allowing [place] to be a part of your care.      Elmer Sow. Pilar Plate, MD [facility] Emergency Medicine [facility] [email or web address]    Sabas Sous, MD 01/06/23 [phone]

## 2023-01-05 NOTE — ED Provider Triage Note (Signed)
Emergency Medicine Provider Triage Evaluation Note  Calvin Bautista , a 82 y.o. male  was evaluated in triage.  Pt complains of fall and dizziness.  Review of Systems  Positive:  Negative:   Physical Exam  BP 128/81 (BP Location: Left Arm)   Pulse 82   Temp 98.6 F (37 C) (Oral)   Resp 16   SpO2 97%  Gen:   Awake, no distress   Resp:  Normal effort  MSK:   Moves extremities without difficulty  Other:    Medical Decision Making  Medically screening exam initiated at 8:47 PM.  Appropriate orders placed.  Calvin Bautista was informed that the remainder of the evaluation will be completed by another provider, this initial triage assessment does not replace that evaluation, and the importance of remaining in the ED until their evaluation is complete.  Patient with mechanical fall last night and then today again at 4:30PM. States that his "legs got weak". Patient denies head trauma, LOC, seizures, blood thinners. Patient felt dizzy after the fall today. Also with left shoulder pain.   Denies fever, chest pain, dyspnea, cough, nausea, vomiting, diarrhea.    Calvin Bautista, [place] 01/05/23 2050

## 2023-01-05 NOTE — ED Triage Notes (Signed)
Pt is coming home with complaints of difficulty walking, he states his been going on for an hour after he fell earlier in which he fell and landed on his left shoulder, no head injury, no LOC, No blood thinners. Was unable to get up on his own, for around 30 to 45 minutes, family was able to get him up and that when he noticed the difficultly walking. Pt has no neurological complaints.   Medic vitals   128/84 104hr 16rr 95%ra 128bgl

## 2023-01-06 NOTE — Discharge Instructions (Signed)
You were evaluated in the Emergency Department and after careful evaluation, we did not find any emergent condition requiring admission or further testing in the hospital.  Your exam/testing today is overall reassuring.  Your x-rays and CT scans did not show any significant injury.  Recommend follow-up with your primary care doctor.  Please return to the Emergency Department if you experience any worsening of your condition.   Thank you for allowing [place] to be a part of your care.

## 2023-01-29 ENCOUNTER — Emergency Department (HOSPITAL_BASED_OUTPATIENT_CLINIC_OR_DEPARTMENT_OTHER)
Admission: EM | Admit: 2023-01-29 | Discharge: 2023-01-29 | Disposition: A | Discharge status: Home / Self Care | Payer: 59 | Attending: Emergency Medicine | Admitting: Emergency Medicine

## 2023-01-29 ENCOUNTER — Emergency Department (HOSPITAL_BASED_OUTPATIENT_CLINIC_OR_DEPARTMENT_OTHER): Payer: 59

## 2023-01-29 ENCOUNTER — Encounter (HOSPITAL_BASED_OUTPATIENT_CLINIC_OR_DEPARTMENT_OTHER): Payer: Self-pay | Admitting: Emergency Medicine

## 2023-01-29 DIAGNOSIS — Z20822 Contact with and (suspected) exposure to covid-19: Secondary | ICD-10-CM | POA: Insufficient documentation

## 2023-01-29 DIAGNOSIS — Z79899 Other long term (current) drug therapy: Secondary | ICD-10-CM | POA: Diagnosis not present

## 2023-01-29 DIAGNOSIS — I10 Essential (primary) hypertension: Secondary | ICD-10-CM | POA: Insufficient documentation

## 2023-01-29 DIAGNOSIS — J209 Acute bronchitis, unspecified: Secondary | ICD-10-CM | POA: Diagnosis not present

## 2023-01-29 DIAGNOSIS — R059 Cough, unspecified: Secondary | ICD-10-CM | POA: Diagnosis present

## 2023-01-29 LAB — RESP PANEL BY RT-PCR (RSV, FLU A&B, COVID)  RVPGX2
Influenza A by PCR: NEGATIVE
Influenza B by PCR: NEGATIVE
Resp Syncytial Virus by PCR: NEGATIVE
SARS Coronavirus 2 by RT PCR: NEGATIVE

## 2023-01-29 MED ORDER — AZITHROMYCIN 250 MG PO TABS: 250.0000 mg | ORAL_TABLET | Freq: Every day | ORAL | 0 refills | Status: DC

## 2023-01-29 MED ORDER — GUAIFENESIN-CODEINE 100-10 MG/5ML PO SOLN: 10.0000 mL | Freq: Four times a day (QID) | ORAL | 0 refills | Status: DC | PRN

## 2023-01-29 NOTE — ED Provider Notes (Signed)
Handoff received from prior provider.  Patient presenting with persistent cough for 3 days with some congestion.  Plan for following up COVID and flu, likely treating for antibiotics regardless of chest x-ray for possible pneumonia.  Evaluation patient is resting comfortable.  Clear lungs.  Vital signs unremarkable.  Normal work of breathing.  COVID and flu are negative.  Chest x-ray reviewed, no clear pneumonia.  Does have slightly worsening cough, suspect is likely viral but will cover with Zithromax for possible early pneumonia or walking pneumonia.  Patient is comfortable this plan and would like to go home.  He is tolerating p.o., no vomiting or diarrhea.  No chest pain or shortness of breath.  Recommend he call his PCP tomorrow to schedule close follow-up this week.  Strict return precautions given.  Discharged in stable condition.   Laurence Spates, MD 01/29/23 [phone]

## 2023-01-29 NOTE — ED Triage Notes (Signed)
Per EMS pt has Cough and congestion with general weakness X 4 days, worse today. Biggest complaint is cough has tried OTC meds with no relief.

## 2023-01-29 NOTE — Discharge Instructions (Addendum)
Begin taking Robitussin with codeine as prescribed as needed for cough.  Primarily take this medication at night because it can make you drowsy.  Complete the entire course of antibiotics.  If you develop difficulty breathing, chest pain, fever, or vomiting return to the ED.

## 2023-01-29 NOTE — ED Provider Notes (Signed)
[place] EMERGENCY DEPARTMENT AT [facility] Provider Note   CSN: [id number] Arrival date & time: 01/29/23  0604     History  Chief Complaint  Patient presents with   Cough    Kiandre Andry is a 83 y.o. male.  Patient is an 83 year old male with history of allergies, hypertension, chronic renal insufficiency, atrial fibrillation.  Patient presenting today for evaluation of cough.  He reports being up coughing the past 3 nights and has gotten little sleep.  Cough has been nonproductive.  He denies any fevers or chills.  He denies ill contacts.  No aggravating or alleviating factors.  The history is provided by the patient.       Home Medications Prior to Admission medications   Medication Sig Start Date End Date Taking? Authorizing Provider  albuterol (VENTOLIN HFA) 108 (90 Base) MCG/ACT inhaler TAKE 2 PUFFS BY MOUTH EVERY 6 HOURS AS NEEDED 12/16/22   Wendling, Jilda Roche, DO  azelastine (ASTELIN) 0.1 % nasal spray Place 1 spray into both nostrils 2 (two) times daily. 04/27/20   [provider]  diltiazem (TIAZAC) 240 MG 24 hr capsule Take 240 mg by mouth daily. 02/08/21 02/21/23  [provider]  fexofenadine (ALLEGRA) 180 MG tablet Take 180 mg by mouth daily.    [provider]  Fluticasone-Umeclidin-Vilant (TRELEGY ELLIPTA) 200-62.5-25 MCG/ACT AEPB Inhale 1 puff daily. 08/31/22   Sharlene Dory, DO  losartan (COZAAR) 50 MG tablet TAKE 1 TABLET BY MOUTH EVERY DAY 11/18/22   Carmelia Roller, Jilda Roche, DO  montelukast (SINGULAIR) 10 MG tablet Take 1 tablet (10 mg total) by mouth at bedtime. 08/31/22   Sharlene Dory, DO  sertraline (ZOLOFT) 25 MG tablet TAKE 1 TABLET (25 MG TOTAL) BY MOUTH DAILY. 09/23/22   Sharlene Dory, DO  tolterodine (DETROL LA) 4 MG 24 hr capsule Take 1 capsule (4 mg total) by mouth daily. 07/27/22   Sharlene Dory, DO      Allergies    Iodine    Review of Systems   Review of Systems   All other systems reviewed and are negative.   Physical Exam Updated Vital Signs Ht 6' (1.829 m)   Wt 108.9 kg   SpO2 98%   BMI 32.55 kg/m  Physical Exam Vitals and nursing note reviewed.  Constitutional:      General: He is not in acute distress.    Appearance: He is well-developed. He is not diaphoretic.  HENT:     Head: Normocephalic and atraumatic.  Cardiovascular:     Rate and Rhythm: Normal rate and regular rhythm.     Heart sounds: No murmur heard.    No friction rub.  Pulmonary:     Effort: Pulmonary effort is normal. No respiratory distress.     Breath sounds: Normal breath sounds. No wheezing or rales.  Abdominal:     General: Bowel sounds are normal. There is no distension.     Palpations: Abdomen is soft.     Tenderness: There is no abdominal tenderness.  Musculoskeletal:        General: Normal range of motion.     Cervical back: Normal range of motion and neck supple.  Skin:    General: Skin is warm and dry.  Neurological:     Mental Status: He is alert and oriented to person, place, and time.     Coordination: Coordination normal.     ED Results / Procedures / Treatments   Labs (all labs ordered  are listed, but only abnormal results are displayed) Labs Reviewed  RESP PANEL BY RT-PCR (RSV, FLU A&B, COVID)  [id number]    EKG EKG Interpretation Date/Time:  Sunday January 29 2023 06:17:46 EST Ventricular Rate:  65 PR Interval:  257 QRS Duration:  96 QT Interval:  524 QTC Calculation: 469 R Axis:   48  Text Interpretation: Sinus rhythm Supraventricular bigeminy Prolonged PR interval Low voltage, precordial leads Confirmed by Geoffery Lyons ([id number]) on 01/29/2023 6:35:54 AM  Radiology No results found.  Procedures Procedures    Medications Ordered in ED Medications - No data to display  ED Course/ Medical Decision Making/ A&P Clinical Course as of 01/29/23 2304  Sun Jan 29, 2023  0659 Cough, waiting on CXR and covid/flu, abx for PNA if  covid/flu negative, likely zithromax, cough meds sent [JD]    Clinical Course User Index [JD] Laurence Spates, MD    Final Clinical Impression(s) / ED Diagnoses Final diagnoses:  None  Patient is an 83 year old male presenting with cough for the past 3 days.  The cough has been keeping him up at night.  Patient arrives with stable vital signs and is afebrile.  Lungs are clear and there is no hypoxia.  Chest x-ray has been ordered as well as a respiratory panel which are both currently pending.  Care signed out to Dr. Earlene Plater at shift change.  He will obtain the results of these studies and determine the final disposition.  Patient is clinically well-appearing and I anticipate discharge.  Rx / DC Orders ED Discharge Orders     None         Geoffery Lyons, MD 01/29/23 2305

## 2023-01-29 NOTE — ED Notes (Signed)
Message left for Calvin Bautista that her husband is ready to be picked up from ED

## 2023-01-30 ENCOUNTER — Telehealth (HOSPITAL_BASED_OUTPATIENT_CLINIC_OR_DEPARTMENT_OTHER): Payer: Self-pay | Admitting: Emergency Medicine

## 2023-01-30 MED ORDER — GUAIFENESIN-CODEINE 100-10 MG/5ML PO SOLN: 10.0000 mL | Freq: Four times a day (QID) | ORAL | 0 refills | Status: DC | PRN

## 2023-01-30 NOTE — Telephone Encounter (Cosign Needed)
3:01 PM Unit secretary received call from patient regarding guaifenesin-codeine that was ordered at previous ED visit by Dr. Judd Lien.  The pharmacy that original prescription was sent to, was out of stock.  They called asking for prescription sent to [facility] on [address], [place].  I reviewed previous chart, patient without any fills of controlled substances recently.  Same prescription sent to new pharmacy.

## 2023-03-17 ENCOUNTER — Other Ambulatory Visit: Payer: Self-pay | Admitting: Family Medicine

## 2023-03-17 DIAGNOSIS — I10 Essential (primary) hypertension: Secondary | ICD-10-CM

## 2023-03-24 ENCOUNTER — Other Ambulatory Visit: Payer: Self-pay | Admitting: Family Medicine

## 2023-03-24 DIAGNOSIS — J45909 Unspecified asthma, uncomplicated: Secondary | ICD-10-CM

## 2023-04-05 ENCOUNTER — Other Ambulatory Visit: Payer: Self-pay | Admitting: Family Medicine

## 2023-04-05 DIAGNOSIS — J4540 Moderate persistent asthma, uncomplicated: Secondary | ICD-10-CM

## 2023-04-18 ENCOUNTER — Ambulatory Visit (INDEPENDENT_AMBULATORY_CARE_PROVIDER_SITE_OTHER): Payer: 59 | Admitting: Family Medicine

## 2023-04-18 ENCOUNTER — Encounter: Payer: Self-pay | Admitting: Family Medicine

## 2023-04-18 VITALS — BP 128/86 | HR 89 | Ht 72.0 in | Wt 243.0 lb

## 2023-04-18 DIAGNOSIS — J4540 Moderate persistent asthma, uncomplicated: Secondary | ICD-10-CM

## 2023-04-18 DIAGNOSIS — F411 Generalized anxiety disorder: Secondary | ICD-10-CM

## 2023-04-18 DIAGNOSIS — N3281 Overactive bladder: Secondary | ICD-10-CM

## 2023-04-18 DIAGNOSIS — J45909 Unspecified asthma, uncomplicated: Secondary | ICD-10-CM

## 2023-04-18 DIAGNOSIS — I10 Essential (primary) hypertension: Secondary | ICD-10-CM | POA: Diagnosis not present

## 2023-04-18 DIAGNOSIS — R7303 Prediabetes: Secondary | ICD-10-CM

## 2023-04-18 MED ORDER — AZELASTINE HCL 0.1 % NA SOLN: 1.0000 | Freq: Two times a day (BID) | NASAL | 2 refills | Status: AC

## 2023-04-18 MED ORDER — DILTIAZEM HCL ER BEADS 240 MG PO CP24: 240.0000 mg | ORAL_CAPSULE | Freq: Every day | ORAL | 2 refills | Status: AC

## 2023-04-18 MED ORDER — FLUTICASONE PROPIONATE 50 MCG/ACT NA SUSP
2.0000 | Freq: Every day | NASAL | 6 refills | Status: AC
Start: 2023-04-18 — End: ?

## 2023-04-18 NOTE — Progress Notes (Signed)
 Chief Complaint  Patient presents with   Medical Management of Chronic Issues    Patient presents today for a 6 month follow-up   Quality Metric Gaps    TDAP    Subjective Calvin Bautista is a 83 y.o. male who presents for hypertension follow up.  Here with his spouse. He does monitor home blood pressures. Blood pressures ranging from 120's/70's on average. He is compliant with medication- losartan 50 mg/d. Patient has these side effects of medication: none He is adhering to a healthy diet overall. Current exercise: golfing, leg lifts No CP or SOB.   Asthma- taking Trelegy 1 puff daily, Singulair 10 mg daily.  Reports compliance and adverse effects.  Breathing is well-controlled overall unless   Patient has a history of overactive bladder.  He is currently taking Detrol LA 4 mg daily.  He reports compliance effects.  Urinating much more infrequently. Doing once per night, improved from 2-3 times per night.  No bleeding, pain, discharge.   Past Medical History:  Diagnosis Date   Cancer (HCC)    Hypertension    Leg weakness, bilateral    left is worse; started around 2014   Seasonal allergies     Exam BP 128/86   Pulse 89   Ht 6' (1.829 m)   Wt 243 lb (110.2 kg)   SpO2 99%   BMI 32.96 kg/m  General:  well developed, well nourished, in no apparent distress Heart: RRR, no bruits, no LE edema Lungs: clear to auscultation, no accessory muscle use Abd: BS+, S, NT, ND Psych: Normal affect  Essential hypertension - Plan: diltiazem (TIAZAC) 240 MG 24 hr capsule  Moderate persistent asthma without complication  GAD (generalized anxiety disorder)  OAB (overactive bladder)  Prediabetes  Asthma due to environmental allergies - Plan: azelastine (ASTELIN) 0.1 % nasal spray, fluticasone (FLONASE) 50 MCG/ACT nasal spray  Chronic, stable. Cont losartan 50 mg/d. Counseled on diet and exercise. Chronic, stable when not in pollen season. Cont Trelegy 1 puff daily, Singulair 10  mg/d.  Chronic, probably stable. Cont Zoloft 25 mg/d.  Chronic, stable.  Continue Detrol 4 mg daily. Monitor. Add back Astelin nasal spray, start Flonase daily.  Continue Singulair and Claritin/Allegra alternating. Tetanus booster recommended.  He would get this at the pharmacy. F/u in 6 months. The patient and his spouse voiced understanding and agreement to the plan.  Jilda Roche [place], DO 04/18/23  1:55 PM

## 2023-04-18 NOTE — Patient Instructions (Addendum)
 Keep the diet clean and stay active.  Aim to do some physical exertion for 150 minutes per week. This is typically divided into 5 days per week, 30 minutes per day. The activity should be enough to get your heart rate up. Anything is better than nothing if you have time constraints.  Consider leg curls/extensions and leg press at the gym.  Consider getting your tetanus booster at the pharmacy.   Let us know if you need anything.

## 2023-06-18 ENCOUNTER — Other Ambulatory Visit: Payer: Self-pay | Admitting: Family Medicine

## 2023-06-18 DIAGNOSIS — I10 Essential (primary) hypertension: Secondary | ICD-10-CM

## 2023-08-06 ENCOUNTER — Emergency Department (HOSPITAL_BASED_OUTPATIENT_CLINIC_OR_DEPARTMENT_OTHER)
Admission: EM | Admit: 2023-08-06 | Discharge: 2023-08-06 | Disposition: A | Discharge status: Home / Self Care | Attending: Emergency Medicine | Admitting: Emergency Medicine

## 2023-08-06 ENCOUNTER — Emergency Department (HOSPITAL_BASED_OUTPATIENT_CLINIC_OR_DEPARTMENT_OTHER)

## 2023-08-06 ENCOUNTER — Encounter (HOSPITAL_BASED_OUTPATIENT_CLINIC_OR_DEPARTMENT_OTHER): Payer: Self-pay | Admitting: Emergency Medicine

## 2023-08-06 ENCOUNTER — Other Ambulatory Visit: Payer: Self-pay

## 2023-08-06 DIAGNOSIS — R519 Headache, unspecified: Secondary | ICD-10-CM | POA: Diagnosis not present

## 2023-08-06 DIAGNOSIS — R0781 Pleurodynia: Secondary | ICD-10-CM | POA: Diagnosis present

## 2023-08-06 DIAGNOSIS — W19XXXA Unspecified fall, initial encounter: Secondary | ICD-10-CM | POA: Insufficient documentation

## 2023-08-06 DIAGNOSIS — M25512 Pain in left shoulder: Secondary | ICD-10-CM | POA: Insufficient documentation

## 2023-08-06 MED ORDER — HYDROCODONE-ACETAMINOPHEN 5-325 MG PO TABS: 1.0000 | ORAL_TABLET | Freq: Four times a day (QID) | ORAL | 0 refills | Status: DC | PRN

## 2023-08-06 MED ORDER — OXYCODONE-ACETAMINOPHEN 5-325 MG PO TABS
2.0000 | ORAL_TABLET | Freq: Once | ORAL | Status: AC
  Administered 2023-08-06: 2 via ORAL
  Filled 2023-08-06: qty 2

## 2023-08-06 NOTE — ED Provider Notes (Signed)
 [facility] Provider Note   CSN: [id number] Arrival date & time: 08/06/23  9966     Patient presents with: Calvin Bautista is a 83 y.o. male.   83 year old male who presents to the ER today secondary to a fall.  Patient states he was going down a hill and his rollator got away from him.  As he is trying to reach for and grab but he fell down on his left side.  He hurt his left shoulder ribs and has had a headache since that time.  This happened a couple days ago.  No shortness of breath but it does hurt when he takes a deep breath.  States he is broken ribs before and it kind of feels similar although that was quite a while ago.  No bruising.  No syncope.  Ambulate okay since then.   Fall       Prior to Admission medications   Medication Sig Start Date End Date Taking? Authorizing Provider  HYDROcodone -acetaminophen  (NORCO/VICODIN) 5-325 MG tablet Take 1 tablet by mouth every 6 (six) hours as needed for severe pain (pain score 7-10). 08/06/23  Yes Cypress Fanfan, Selinda, MD  albuterol  (VENTOLIN  HFA) 108 (90 Base) MCG/ACT inhaler Inhale 2 puffs into the lungs every 6 (six) hours as needed for wheezing or shortness of breath. 04/05/23   Wendling, Mabel Mt, DO  azelastine  (ASTELIN ) 0.1 % nasal spray Place 1 spray into both nostrils 2 (two) times daily. 04/18/23   Frann Mabel Mt, DO  diltiazem  (TIAZAC ) 240 MG 24 hr capsule Take 1 capsule (240 mg total) by mouth daily. 04/18/23   Frann Mabel Mt, DO  fexofenadine (ALLEGRA) 180 MG tablet Take 180 mg by mouth daily.    [provider]  fluticasone  (FLONASE ) 50 MCG/ACT nasal spray Place 2 sprays into both nostrils daily. 04/18/23   Frann Mabel Mt, DO  Fluticasone -Umeclidin-Vilant (TRELEGY ELLIPTA ) 200-62.5-25 MCG/ACT AEPB Inhale 1 puff daily. 08/31/22   Frann Mabel Mt, DO  losartan  (COZAAR ) 50 MG tablet Take 1 tablet (50 mg total) by mouth daily. 06/19/23    Frann Mabel Mt, DO  montelukast  (SINGULAIR ) 10 MG tablet TAKE 1 TABLET BY MOUTH EVERYDAY AT BEDTIME 03/24/23   Wendling, Mabel Mt, DO  sertraline  (ZOLOFT ) 25 MG tablet TAKE 1 TABLET (25 MG TOTAL) BY MOUTH DAILY. 09/23/22   Frann Mabel Mt, DO  tolterodine  (DETROL  LA) 4 MG 24 hr capsule Take 1 capsule (4 mg total) by mouth daily. 07/27/22   Frann Mabel Mt, DO    Allergies: Iodine, Ivp dye [iodinated contrast media], and Shellfish allergy    Review of Systems  Updated Vital Signs BP 114/79 (BP Location: Right Arm)   Pulse 80   Temp 98.6 F (37 C) (Oral)   Resp 20   SpO2 100%   Physical Exam Vitals and nursing note reviewed.  Constitutional:      Appearance: He is well-developed.  HENT:     Head: Normocephalic and atraumatic.  Cardiovascular:     Rate and Rhythm: Normal rate.  Pulmonary:     Effort: Pulmonary effort is normal. No respiratory distress.  Abdominal:     General: There is no distension.  Musculoskeletal:        General: Tenderness (Left rib cage) present. Normal range of motion.     Cervical back: Normal range of motion.  Neurological:     General: No focal deficit present.     Mental Status: He is  alert.     (all labs ordered are listed, but only abnormal results are displayed) Labs Reviewed - No data to display  EKG: None  Radiology: DG Shoulder Left Result Date: 08/06/2023 CLINICAL DATA:  Fall with left shoulder injury, left ribcage injury. EXAM: LEFT RIBS AND CHEST - 3+ VIEW; LEFT SHOULDER - 2+ VIEW COMPARISON:  Left shoulder series 01/05/2023, portable chest 01/29/2023. FINDINGS: Three views of left shoulder: There is moderate spurring along the greater tuberosity, moderate narrowing and osteophytosis AC joint and mild inferior spurring at the glenohumeral joint with partial inferior glenohumeral joint space loss. There is no evidence of fracture or dislocation. Soft tissues are unremarkable. The bone mineralization is normal.  Similar findings were noted previously. Six views total with AP chest and five views of the left ribs: No displaced rib fracture or other focal abnormality of the ribs is seen. There is no pneumothorax or pleural effusion. The cardiac size is normal. Stable mediastinum with mild aortic atherosclerosis and tortuosity. The lungs are clear of infiltrates.  Mild thoracic spondylosis. IMPRESSION: 1. No evidence of fracture or dislocation of the left shoulder. Degenerative changes. 2. No evidence of acute chest disease or displaced rib fracture. 3. Aortic atherosclerosis. Electronically Signed   By: Francis Quam M.D.   On: 08/06/2023 02:10   DG Ribs Unilateral W/Chest Left Result Date: 08/06/2023 CLINICAL DATA:  Fall with left shoulder injury, left ribcage injury. EXAM: LEFT RIBS AND CHEST - 3+ VIEW; LEFT SHOULDER - 2+ VIEW COMPARISON:  Left shoulder series 01/05/2023, portable chest 01/29/2023. FINDINGS: Three views of left shoulder: There is moderate spurring along the greater tuberosity, moderate narrowing and osteophytosis AC joint and mild inferior spurring at the glenohumeral joint with partial inferior glenohumeral joint space loss. There is no evidence of fracture or dislocation. Soft tissues are unremarkable. The bone mineralization is normal. Similar findings were noted previously. Six views total with AP chest and five views of the left ribs: No displaced rib fracture or other focal abnormality of the ribs is seen. There is no pneumothorax or pleural effusion. The cardiac size is normal. Stable mediastinum with mild aortic atherosclerosis and tortuosity. The lungs are clear of infiltrates.  Mild thoracic spondylosis. IMPRESSION: 1. No evidence of fracture or dislocation of the left shoulder. Degenerative changes. 2. No evidence of acute chest disease or displaced rib fracture. 3. Aortic atherosclerosis. Electronically Signed   By: Francis Quam M.D.   On: 08/06/2023 02:10   CT Head Wo Contrast Result  Date: 08/06/2023 CLINICAL DATA:  Head trauma.  Venous injury suspected. EXAM: CT HEAD WITHOUT CONTRAST TECHNIQUE: Contiguous axial images were obtained from the base of the skull through the vertex without intravenous contrast. RADIATION DOSE REDUCTION: This exam was performed according to the departmental dose-optimization program which includes automated exposure control, adjustment of the mA and/or kV according to patient size and/or use of iterative reconstruction technique. COMPARISON:  Head CT 01/05/2023 FINDINGS: Brain: There is moderate global cerebral volume loss with atrophic ventriculomegaly and mild cerebellar atrophy. No cortical based acute infarct, hemorrhage, mass effect or midline shift are seen. Basal cisterns are clear. There is mild small-vessel disease of the cerebral white matter. Sellar contents unremarkable. Vascular: There are patchy calcific plaques in the siphons. No hyperdense central vessel is seen. Skull: Negative for fractures or focal lesions. Sinuses/Orbits: Old lens extractions. Chronic depressed fracture medial wall right orbit. Orbital contents are otherwise unremarkable. There is mild disease in the ethmoid air cells. Other sinuses, bilateral mastoids, and  middle ears are clear. Other: None. IMPRESSION: 1. No acute intracranial CT findings or depressed skull fractures. 2. Atrophy and small-vessel disease. 3. Chronic depressed fracture medial wall right orbit. 4. Mild ethmoid sinus disease. Electronically Signed   By: Francis Quam M.D.   On: 08/06/2023 02:03     Procedures   Medications Ordered in the ED  oxyCODONE -acetaminophen  (PERCOCET/ROXICET) 5-325 MG per tablet 2 tablet (2 tablets Oral Given 08/06/23 0112)                                    Medical Decision Making Amount and/or Complexity of Data Reviewed Radiology: ordered.  Risk Prescription drug management.   No obvious fractures. No VS abnormalities to suggest need for more advanced imaging.  Symptoms treated here. Stable for d/c with pain control. Pcp FU if nt improving.      Final diagnoses:  Rib pain    ED Discharge Orders          Ordered    HYDROcodone -acetaminophen  (NORCO/VICODIN) 5-325 MG tablet  Every 6 hours PRN        08/06/23 0256               Tiwanna Tuch, Selinda, MD 08/06/23 9489

## 2023-08-06 NOTE — ED Notes (Signed)
 Pt designated high fall risk. Yellow bracelet applied, nonskid socks given, sign posted on door. Pt instructed to call for help when getting up. Verbalized understanding.

## 2023-08-06 NOTE — ED Triage Notes (Signed)
 Pt states he was walking with his rollator on Tuesday and lost control of it going down hill causing him to fall. He reports L sided anterior rib pain and L shoulder pain. Denies striking his head, no LOC. He does report having headaches today. Pt describes his headache as pulsating. Pt is A&O, answers questions without difficulty.

## 2023-09-20 ENCOUNTER — Encounter: Payer: Self-pay | Admitting: Cardiovascular Disease

## 2023-09-21 ENCOUNTER — Other Ambulatory Visit: Payer: Self-pay | Admitting: Family Medicine

## 2023-09-21 DIAGNOSIS — I10 Essential (primary) hypertension: Secondary | ICD-10-CM

## 2023-09-25 ENCOUNTER — Ambulatory Visit: Admitting: Family Medicine

## 2023-09-25 ENCOUNTER — Ambulatory Visit: Payer: Self-pay

## 2023-09-25 ENCOUNTER — Encounter: Payer: Self-pay | Admitting: Family Medicine

## 2023-09-25 VITALS — BP 126/82 | HR 68 | Temp 97.9°F | Resp 16 | Ht 73.0 in | Wt 244.6 lb

## 2023-09-25 DIAGNOSIS — M7502 Adhesive capsulitis of left shoulder: Secondary | ICD-10-CM | POA: Diagnosis not present

## 2023-09-25 MED ORDER — METHYLPREDNISOLONE ACETATE 40 MG/ML IJ SUSP
40.0000 mg | Freq: Once | INTRAMUSCULAR | Status: AC
  Administered 2023-09-25: 40 mg via INTRAMUSCULAR

## 2023-09-25 NOTE — Telephone Encounter (Signed)
 Appt scheduled

## 2023-09-25 NOTE — Patient Instructions (Addendum)
 Heat (pad or rice pillow in microwave) over affected area, 10-15 minutes twice daily.   Ice/cold pack over area for 10-15 min twice daily.  OK to take Tylenol  1000 mg (2 extra strength tabs) or 975 mg (3 regular strength tabs) every 6 hours as needed.  Send me a message in 1 month if no better and we will get you in with the physical therapy team.   Let us  know if you need anything.  EXERCISES  RANGE OF MOTION (ROM) AND STRETCHING EXERCISES These exercises may help you when beginning to rehabilitate your injury. While completing these exercises, remember:  Restoring tissue flexibility helps normal motion to return to the joints. This allows healthier, less painful movement and activity. An effective stretch should be held for at least 30 seconds. A stretch should never be painful. You should only feel a gentle lengthening or release in the stretched tissue.  ROM - Pendulum Bend at the waist so that your right / left arm falls away from your body. Support yourself with your opposite hand on a solid surface, such as a table or a countertop. Your right / left arm should be perpendicular to the ground. If it is not perpendicular, you need to lean over farther. Relax the muscles in your right / left arm and shoulder as much as possible. Gently sway your hips and trunk so they move your right / left arm without any use of your right / left shoulder muscles. Progress your movements so that your right / left arm moves side to side, then forward and backward, and finally, both clockwise and counterclockwise. Complete 10-15 repetitions in each direction. Many people use this exercise to relieve discomfort in their shoulder as well as to gain range of motion. Repeat 2 times. Complete this exercise 3 times per week.  STRETCH - Flexion, Standing Stand with good posture. With an underhand grip on your right / left hand and an overhand grip on the opposite hand, grasp a broomstick or cane so that your  hands are a little more than shoulder-width apart. Keeping your right / left elbow straight and shoulder muscles relaxed, push the stick with your opposite hand to raise your right / left arm in front of your body and then overhead. Raise your arm until you feel a stretch in your right / left shoulder, but before you have increased shoulder pain. Try to avoid shrugging your right / left shoulder as your arm rises by keeping your shoulder blade tucked down and toward your mid-back spine. Hold 30 seconds. Slowly return to the starting position. Repeat 2 times. Complete this exercise 3 times per week.  STRETCH - Internal Rotation Place your right / left hand behind your back, palm-up. Throw a towel or belt over your opposite shoulder. Grasp the towel/belt with your right / left hand. While keeping an upright posture, gently pull up on the towel/belt until you feel a stretch in the front of your right / left shoulder. Avoid shrugging your right / left shoulder as your arm rises by keeping your shoulder blade tucked down and toward your mid-back spine. Hold 30. Release the stretch by lowering your opposite hand. Repeat 2 times. Complete this exercise 3 times per week.  STRETCH - External Rotation and Abduction Stagger your stance through a doorframe. It does not matter which foot is forward. As instructed by your physician, physical therapist or athletic trainer, place your hands: And forearms above your head and on the door frame. And  forearms at head-height and on the door frame. At elbow-height and on the door frame. Keeping your head and chest upright and your stomach muscles tight to prevent over-extending your low-back, slowly shift your weight onto your front foot until you feel a stretch across your chest and/or in the front of your shoulders. Hold 30 seconds. Shift your weight to your back foot to release the stretch. Repeat 2 times. Complete this stretch 3 times per week.   STRENGTHENING  EXERCISES  These exercises may help you when beginning to rehabilitate your injury. They may resolve your symptoms with or without further involvement from your physician, physical therapist or athletic trainer. While completing these exercises, remember:  Muscles can gain both the endurance and the strength needed for everyday activities through controlled exercises. Complete these exercises as instructed by your physician, physical therapist or athletic trainer. Progress the resistance and repetitions only as guided. You may experience muscle soreness or fatigue, but the pain or discomfort you are trying to eliminate should never worsen during these exercises. If this pain does worsen, stop and make certain you are following the directions exactly. If the pain is still present after adjustments, discontinue the exercise until you can discuss the trouble with your clinician. If advised by your physician, during your recovery, avoid activity or exercises which involve actions that place your right / left hand or elbow above your head or behind your back or head. These positions stress the tissues which are trying to heal.  STRENGTH - Scapular Depression and Adduction With good posture, sit on a firm chair. Supported your arms in front of you with pillows, arm rests or a table top. Have your elbows in line with the sides of your body. Gently draw your shoulder blades down and toward your mid-back spine. Gradually increase the tension without tensing the muscles along the top of your shoulders and the back of your neck. Hold for 3 seconds. Slowly release the tension and relax your muscles completely before completing the next repetition. After you have practiced this exercise, remove the arm support and complete it in standing as well as sitting. Repeat 2 times. Complete this exercise 3 times per week.   STRENGTH - External Rotators Secure a rubber exercise band/tubing to a fixed object so that it is at  the same height as your right / left elbow when you are standing or sitting on a firm surface. Stand or sit so that the secured exercise band/tubing is at your side that is not injured. Bend your elbow 90 degrees. Place a folded towel or small pillow under your right / left arm so that your elbow is a few inches away from your side. Keeping the tension on the exercise band/tubing, pull it away from your body, as if pivoting on your elbow. Be sure to keep your body steady so that the movement is only coming from your shoulder rotating. Hold 3 seconds. Release the tension in a controlled manner as you return to the starting position. Repeat 2 times. Complete this exercise 3 times per week.   STRENGTH - Supraspinatus Stand or sit with good posture. Grasp a 2-3 lb weight or an exercise band/tubing so that your hand is thumbs-up, like when you shake hands. Slowly lift your right / left hand from your thigh into the air, traveling about 30 degrees from straight out at your side. Lift your hand to shoulder height or as far as you can without increasing any shoulder pain. Initially, many  people do not lift their hands above shoulder height. Avoid shrugging your right / left shoulder as your arm rises by keeping your shoulder blade tucked down and toward your mid-back spine. Hold for 3 seconds. Control the descent of your hand as you slowly return to your starting position. Repeat 2 times. Complete this exercise 3 times per week.   STRENGTH - Shoulder Extensors Secure a rubber exercise band/tubing so that it is at the height of your shoulders when you are either standing or sitting on a firm arm-less chair. With a thumbs-up grip, grasp an end of the band/tubing in each hand. Straighten your elbows and lift your hands straight in front of you at shoulder height. Step back away from the secured end of band/tubing until it becomes tense. Squeezing your shoulder blades together, pull your hands down to the  sides of your thighs. Do not allow your hands to go behind you. Hold for 3 seconds. Slowly ease the tension on the band/tubing as you reverse the directions and return to the starting position. Repeat 2 times. Complete this exercise 3 times per week.   STRENGTH - Scapular Retractors Secure a rubber exercise band/tubing so that it is at the height of your shoulders when you are either standing or sitting on a firm arm-less chair. With a palm-down grip, grasp an end of the band/tubing in each hand. Straighten your elbows and lift your hands straight in front of you at shoulder height. Step back away from the secured end of band/tubing until it becomes tense. Squeezing your shoulder blades together, draw your elbows back as you bend them. Keep your upper arm lifted away from your body throughout the exercise. Hold 3 seconds. Slowly ease the tension on the band/tubing as you reverse the directions and return to the starting position. Repeat 2 times. Complete this exercise 3 times per week.  STRENGTH - Scapular Depressors Find a sturdy chair without wheels, such as a from a dining room table. Keeping your feet on the floor, lift your bottom from the seat and lock your elbows. Keeping your elbows straight, allow gravity to pull your body weight down. Your shoulders will rise toward your ears. Raise your body against gravity by drawing your shoulder blades down your back, shortening the distance between your shoulders and ears. Although your feet should always maintain contact with the floor, your feet should progressively support less body weight as you get stronger. Hold 3 seconds. In a controlled and slow manner, lower your body weight to begin the next repetition. Repeat 2 times. Complete this exercise 3 times per week.    This information is not intended to replace advice given to you by your health care provider. Make sure you discuss any questions you have with your health care provider.    Document Released: 11/10/2004 Document Revised: 01/17/2014 Document Reviewed: 04/10/2008 Elsevier Interactive Patient Education [employer]

## 2023-09-25 NOTE — Progress Notes (Signed)
 Musculoskeletal Exam  Patient: Calvin Bautista DOB: 02-28-1940  DOS: 09/25/2023  SUBJECTIVE:  Chief Complaint:   Chief Complaint  Patient presents with   Pain    Arm Pain    Calvin Bautista is a 83 y.o.  male for evaluation and treatment of arm pain.   Onset:  1 month ago. Fell on LUE after his walker malfunctioned.  Location: Top of L shoulder, upper L arm Character:  aching and burning  Progression of issue:  has slightly improved Associated symptoms: decreased ROM No bruising, redness, swelling, catching/locking Treatment: to date has been rest, topical menthol, heat, ice, Tylenol  Neurovascular symptoms: no  Past Medical History:  Diagnosis Date   Cancer (HCC)    Hypertension    Leg weakness, bilateral    left is worse; started around 2014   Seasonal allergies     Objective: VITAL SIGNS: BP 126/82 (BP Location: Left Arm, Patient Position: Sitting)   Pulse 68   Temp 97.9 F (36.6 C) (Oral)   Resp 16   Ht 6' 1 (1.854 m)   Wt 244 lb 9.6 oz (110.9 kg)   SpO2 98%   BMI 32.27 kg/m  Constitutional: Well formed, well developed. No acute distress. Thorax & Lungs: No accessory muscle use Musculoskeletal: L shoulder.   Normal active range of motion: Decreased forward flexion Normal passive range of motion: Decreased forward flexion as well Tenderness to palpation: slight anterior shoulder ttp Deformity: no Ecchymosis: no Tests positive: Neer's, empty can Tests negative: Hawkins, crossover, speeds, liftoff Neurologic: Normal sensory function.  Psychiatric: Normal mood. Age appropriate judgment and insight. Alert & oriented x 3.    Procedure Note; Shoulder joint injection Verbal consent obtained. The area was palpated, an area was marked posterior to the acromion process approximately 2 cm inferiorly, and cleaned with Betadine x1. A 27-gauge needle, while aiming towards the coracoid process, was used to enter the joint posteriorally with ease. 40 mg of Depo-Medrol   with 2 mL of 1% lidocaine was injected. The patient tolerated the procedure well. There were no complications noted.  Assessment:  Adhesive capsulitis of left shoulder - Plan: methylPREDNISolone  acetate (DEPO-MEDROL ) injection 40 mg, PR ARTHROCENTESIS ASPIR&/INJ MAJOR JT/BURSA W/O US   Plan: Steroid injection today.  Stretches/exercises, heat, ice, Tylenol .  F/u prn. The patient voiced understanding and agreement to the plan.   Mabel Mt [place], DO 09/25/23  3:22 PM

## 2023-09-25 NOTE — Telephone Encounter (Signed)
 FYI Only or Action Required?: Action required by provider: request for appointment.  Patient was last seen in primary care on 04/18/2023 by Frann Mabel Mt, DO.  Called Nurse Triage reporting Shoulder Pain.  Symptoms began several weeks ago.  Interventions attempted: OTC medications: cream.  Symptoms are: unchanged.  Triage Disposition: See PCP When Office is Open (Within 3 Days)  Patient/caregiver understands and will follow disposition?: Yes  Copied from CRM #[id number]. Topic: Clinical - Red Word Triage >> Sep 25, 2023 12:35 PM Mesmerise C wrote: [facility] that prompted transfer to Nurse Triage: Patient stated he was running and he fell and hurt his left shoulder and arm about 4 weeks ago Reason for Disposition  [1] MODERATE pain (e.g., interferes with normal activities) AND [2] present > 3 days  Answer Assessment - Initial Assessment Questions Appt 09/25/23  1. ONSET: When did the pain start?     7 weeks ago; tripped 2. LOCATION: Where is the pain located?     Left shoulder down to elbow 3. PAIN: How bad is the pain? (Scale 1-10; or mild, moderate, severe)     8/10;otc cream 4. WORK OR EXERCISE: Has there been any recent work or exercise that involved this part of the body?     na 5. CAUSE: What do you think is causing the shoulder pain?     fall 6. OTHER SYMPTOMS: Do you have any other symptoms? (e.g., neck pain, swelling, rash, fever, numbness, weakness)     Denies Can move arm , just hurts Denies chest pain,  jaw pain, back, numbness/weakness  Protocols used: Shoulder Pain-A-AH

## 2023-10-04 ENCOUNTER — Other Ambulatory Visit: Payer: Self-pay | Admitting: Family Medicine

## 2023-10-04 DIAGNOSIS — J4540 Moderate persistent asthma, uncomplicated: Secondary | ICD-10-CM

## 2023-10-26 ENCOUNTER — Other Ambulatory Visit: Payer: Self-pay | Admitting: Family Medicine

## 2023-10-26 DIAGNOSIS — N3281 Overactive bladder: Secondary | ICD-10-CM

## 2023-10-26 DIAGNOSIS — I10 Essential (primary) hypertension: Secondary | ICD-10-CM

## 2023-10-26 DIAGNOSIS — J45909 Unspecified asthma, uncomplicated: Secondary | ICD-10-CM

## 2023-11-23 ENCOUNTER — Telehealth: Payer: Self-pay | Admitting: *Deleted

## 2023-11-23 NOTE — Telephone Encounter (Signed)
 Pt was scheduled for in person AWV today at 1pm. No answer at home or cell# and message left to call and r/s AWV.  Ok for E2C2 to place on wellness visit 1 schedule at [employer].

## 2023-12-13 ENCOUNTER — Telehealth: Payer: Self-pay | Admitting: Family Medicine

## 2023-12-13 NOTE — Telephone Encounter (Signed)
 Copied from CRM #[id number]. Topic: Medicare AWV >> Dec 13, 2023  2:21 PM Nathanel DEL wrote: Called LVM 12/13/2023 to sched AWV. Please schedule in office or virtual visit.   Nathanel Paschal; Care Guide Ambulatory Clinical Support Velma l [facility] Direct Dial: [phone]

## 2024-02-02 ENCOUNTER — Encounter: Payer: Self-pay | Admitting: Family Medicine

## 2024-02-02 ENCOUNTER — Ambulatory Visit: Admitting: Family Medicine

## 2024-02-02 VITALS — BP 126/78 | HR 78 | Temp 98.0°F | Resp 16 | Ht 73.0 in | Wt 243.0 lb

## 2024-02-02 DIAGNOSIS — R269 Unspecified abnormalities of gait and mobility: Secondary | ICD-10-CM | POA: Diagnosis not present

## 2024-02-02 DIAGNOSIS — R195 Other fecal abnormalities: Secondary | ICD-10-CM | POA: Diagnosis not present

## 2024-02-02 DIAGNOSIS — N3281 Overactive bladder: Secondary | ICD-10-CM

## 2024-02-02 NOTE — Progress Notes (Signed)
 Chief Complaint  Patient presents with   Constipation    Constipation     Subjective: Patient is a 84 y.o. male here for several things.  He is here with his wife who helps with the history.  Patient has an unsteady gait.  He uses a cane to help walk.  He went to physical therapy in the past which was helpful but he is starting to worsen again since leaving.  He does his home exercise program once per week on average.  He is thinking about joining a gym.  No recent trauma or pain explaining his symptoms.  Over the past several months, the patient has been having curved stools.  Sometimes they are pencil thin.  He has intermittent normal bowel movements as well.  Diet could be better as he does have a strong sweet tooth.  Denies any tarry stools, bleeding, abdominal pain, rectal pain, nausea, vomiting.  He does have some intermittent constipation which is quickly resolved with MiraLAX.  He uses this rarely.  Over the past couple months, he has noticed he urinates frequently in the night.  He does not have a prostate with a history of prostate cancer.  He takes tolterodine  4 mg daily.  Compliant, no adverse effects.  He notices when he cuts down on evening fluid intake, he only urinates once or twice per night.  Otherwise it could be 3-4 times per night.  Denies bleeding, discharge, or pain.  No constipation correlated with his symptoms.  Past Medical History:  Diagnosis Date   Cancer (HCC)    Hypertension    Leg weakness, bilateral    left is worse; started around 2014   Seasonal allergies     Objective: BP 126/78 (BP Location: Left Arm, Patient Position: Sitting)   Pulse 78   Temp 98 F (36.7 C) (Oral)   Resp 16   Ht 6' 1 (1.854 m)   Wt 243 lb (110.2 kg)   SpO2 95%   BMI 32.06 kg/m  General: Awake, appears stated age Heart: RRR, no LE edema Lungs: CTAB, no rales, wheezes or rhonchi. No accessory muscle use Neuro: Gait is slow and cautious, 4/5 strength with bilateral knee  flexion and hip flexion, 5/5 strength in the extremities otherwise MSK: Poor hamstring range of motion bilaterally Abdomen: Bowel sounds present, soft, nontender, nondistended, no masses or organomegaly Psych: Age appropriate judgment and insight, normal affect and mood  Assessment and Plan: Gait abnormality - Plan: Ambulatory referral to Physical Therapy  Change in stool caliber  OAB (overactive bladder)  Refer back to physical therapy, [place] this time.  Recommended he do his home exercise plan from his previous physical therapy stent.  Continue using an ambulatory device as needed. I believe this is probably dietary related.  Stay hydrated.  Consider a fiber supplement.  Okay to use a laxative as needed.  No red flag signs/symptoms. Chronic, relatively stable.  He will decrease fluid intake 90 minutes before planned bedtime.  Continue tolterodine  4 mg daily.  He will let me know if this behavioral modification does not help with the symptoms. The patient and his spouse voiced understanding and agreement to the plan.  I spent 32 minutes with the patient and his wife discussing the above plans in addition to reviewing his chart on the same date of visit.  Mabel [place], DO 02/02/24  10:57 AM

## 2024-02-02 NOTE — Patient Instructions (Addendum)
 Try to limit fluid intake 90 minutes prior to planned bedtime.  Continue the Tolterodine .   Try to drink 55-60 oz of water daily outside of exercise.  Take Metamucil or Benefiber daily as needed.  If you do not hear anything about your referral in the next 1-2 weeks, call our office and ask for an update.  Let us  know if you need anything.
